# Patient Record
Sex: Female | Born: 1960 | Race: White | Hispanic: No | Marital: Married | Smoking: Former smoker
Health system: Southern US, Community
[De-identification: ages and names within clinical notes are randomized; demographics above are authoritative.]

## PROBLEM LIST (undated history)

## (undated) DIAGNOSIS — R51 Headache: Secondary | ICD-10-CM

## (undated) DIAGNOSIS — N3281 Overactive bladder: Secondary | ICD-10-CM

## (undated) DIAGNOSIS — G4730 Sleep apnea, unspecified: Secondary | ICD-10-CM

## (undated) DIAGNOSIS — N83209 Unspecified ovarian cyst, unspecified side: Secondary | ICD-10-CM

## (undated) DIAGNOSIS — G4700 Insomnia, unspecified: Secondary | ICD-10-CM

## (undated) DIAGNOSIS — R519 Headache, unspecified: Secondary | ICD-10-CM

## (undated) DIAGNOSIS — I10 Essential (primary) hypertension: Secondary | ICD-10-CM

## (undated) DIAGNOSIS — J45909 Unspecified asthma, uncomplicated: Secondary | ICD-10-CM

## (undated) DIAGNOSIS — B009 Herpesviral infection, unspecified: Secondary | ICD-10-CM

## (undated) DIAGNOSIS — K219 Gastro-esophageal reflux disease without esophagitis: Secondary | ICD-10-CM

## (undated) DIAGNOSIS — J302 Other seasonal allergic rhinitis: Secondary | ICD-10-CM

## (undated) DIAGNOSIS — E669 Obesity, unspecified: Secondary | ICD-10-CM

## (undated) HISTORY — DX: Gastro-esophageal reflux disease without esophagitis: K21.9

## (undated) HISTORY — DX: Essential (primary) hypertension: I10

## (undated) HISTORY — DX: Unspecified asthma, uncomplicated: J45.909

## (undated) HISTORY — PX: UPPER GI ENDOSCOPY: SHX6162

## (undated) HISTORY — DX: Insomnia, unspecified: G47.00

## (undated) HISTORY — DX: Other seasonal allergic rhinitis: J30.2

## (undated) HISTORY — PX: WRIST SURGERY: SHX841

## (undated) HISTORY — PX: GASTROSCOPY: SHX1701

## (undated) HISTORY — PX: KNEE ARTHROPLASTY: SHX992

## (undated) HISTORY — DX: Unspecified ovarian cyst, unspecified side: N83.209

## (undated) HISTORY — DX: Obesity, unspecified: E66.9

## (undated) HISTORY — PX: SHOULDER ARTHROSCOPY: SHX128

## (undated) HISTORY — DX: Overactive bladder: N32.81

## (undated) HISTORY — DX: Herpesviral infection, unspecified: B00.9

---

## 1985-10-20 HISTORY — PX: APPENDECTOMY: SHX54

## 1995-10-21 HISTORY — PX: WRIST SURGERY: SHX841

## 1997-10-20 HISTORY — PX: TONSILLECTOMY: SUR1361

## 1999-10-21 HISTORY — PX: BREAST EXCISIONAL BIOPSY: SUR124

## 2001-10-20 HISTORY — PX: EYE SURGERY: SHX253

## 2001-10-20 HISTORY — PX: BLADDER SURGERY: SHX569

## 2003-05-02 ENCOUNTER — Other Ambulatory Visit: Admission: RE | Admit: 2003-05-02 | Discharge: 2003-05-02 | Payer: Self-pay | Admitting: Obstetrics & Gynecology

## 2004-06-07 ENCOUNTER — Other Ambulatory Visit: Admission: RE | Admit: 2004-06-07 | Discharge: 2004-06-07 | Payer: Self-pay | Admitting: Obstetrics & Gynecology

## 2005-07-08 ENCOUNTER — Other Ambulatory Visit: Admission: RE | Admit: 2005-07-08 | Discharge: 2005-07-08 | Payer: Self-pay | Admitting: Obstetrics & Gynecology

## 2005-08-26 ENCOUNTER — Ambulatory Visit: Payer: Self-pay

## 2006-03-12 ENCOUNTER — Ambulatory Visit: Payer: Self-pay | Admitting: Emergency Medicine

## 2006-05-19 ENCOUNTER — Emergency Department: Payer: Self-pay | Admitting: Emergency Medicine

## 2006-08-28 ENCOUNTER — Ambulatory Visit: Payer: Self-pay

## 2008-03-21 ENCOUNTER — Ambulatory Visit: Payer: Self-pay

## 2009-04-25 ENCOUNTER — Ambulatory Visit: Payer: Self-pay | Admitting: Specialist

## 2009-05-24 ENCOUNTER — Ambulatory Visit: Payer: Self-pay | Admitting: Specialist

## 2009-06-01 ENCOUNTER — Ambulatory Visit: Payer: Self-pay | Admitting: Specialist

## 2009-11-29 ENCOUNTER — Ambulatory Visit: Payer: Self-pay

## 2010-05-14 ENCOUNTER — Ambulatory Visit: Payer: Self-pay

## 2010-05-24 ENCOUNTER — Ambulatory Visit: Payer: Self-pay | Admitting: Unknown Physician Specialty

## 2010-05-28 LAB — PATHOLOGY REPORT

## 2010-08-08 ENCOUNTER — Encounter: Payer: Self-pay | Admitting: General Practice

## 2010-08-20 ENCOUNTER — Encounter: Payer: Self-pay | Admitting: General Practice

## 2010-09-19 ENCOUNTER — Encounter: Payer: Self-pay | Admitting: General Practice

## 2011-10-01 ENCOUNTER — Ambulatory Visit: Payer: Self-pay | Admitting: Obstetrics & Gynecology

## 2011-11-30 ENCOUNTER — Ambulatory Visit: Payer: Self-pay | Admitting: Specialist

## 2012-02-13 ENCOUNTER — Ambulatory Visit: Payer: Self-pay | Admitting: Specialist

## 2012-02-13 LAB — CBC
HCT: 41 % (ref 35.0–47.0)
HGB: 13.2 g/dL (ref 12.0–16.0)
MCH: 27.1 pg (ref 26.0–34.0)
MCHC: 32.1 g/dL (ref 32.0–36.0)
MCV: 84 fL (ref 80–100)
Platelet: 273 10*3/uL (ref 150–440)
RBC: 4.86 10*6/uL (ref 3.80–5.20)
RDW: 16.3 % — ABNORMAL HIGH (ref 11.5–14.5)
WBC: 4.9 10*3/uL (ref 3.6–11.0)

## 2012-02-20 ENCOUNTER — Ambulatory Visit: Payer: Self-pay | Admitting: Specialist

## 2012-03-15 ENCOUNTER — Encounter: Payer: Self-pay | Admitting: Specialist

## 2012-03-20 ENCOUNTER — Encounter: Payer: Self-pay | Admitting: Specialist

## 2012-04-19 ENCOUNTER — Encounter: Payer: Self-pay | Admitting: Specialist

## 2012-10-06 ENCOUNTER — Encounter: Payer: Self-pay | Admitting: Podiatry

## 2012-10-20 ENCOUNTER — Encounter: Payer: Self-pay | Admitting: Podiatry

## 2012-11-20 ENCOUNTER — Encounter: Payer: Self-pay | Admitting: Podiatry

## 2012-12-08 ENCOUNTER — Ambulatory Visit: Payer: Self-pay | Admitting: Obstetrics & Gynecology

## 2014-05-16 ENCOUNTER — Ambulatory Visit: Payer: Self-pay | Admitting: Specialist

## 2014-10-10 ENCOUNTER — Ambulatory Visit: Payer: Self-pay | Admitting: Anesthesiology

## 2014-10-10 LAB — POTASSIUM: POTASSIUM: 3.7 mmol/L (ref 3.5–5.1)

## 2014-10-17 ENCOUNTER — Ambulatory Visit: Payer: Self-pay | Admitting: Specialist

## 2015-01-30 ENCOUNTER — Encounter: Payer: Self-pay | Admitting: *Deleted

## 2015-02-11 NOTE — Op Note (Signed)
PATIENT NAME:  April Holloway, April Holloway MR#:  [id number] DATE OF BIRTH:  1961/05/27  DATE OF PROCEDURE:  02/20/2012  PREOPERATIVE DIAGNOSES:  1. Severe degenerative arthritis of acromioclavicular joint, right shoulder.  2. Rotator cuff tear, right shoulder.   POSTOPERATIVE DIAGNOSES:  1. Severe impingement syndrome, right shoulder.  2. Severe acromioclavicular degenerative arthritis.   PROCEDURES:  1. Open anterior acromioplasty.  2. Open excision distal right clavicle.   SURGEON: Myra Rudehristopher Zakari Couchman, M.D.   ANESTHESIA: Supraclavicular block and general.   COMPLICATIONS: None.   ESTIMATED BLOOD LOSS: 100 mL.   DESCRIPTION OF PROCEDURE: One gram of Ancef was given intravenously prior to the procedure. Supraclavicular block and then general anesthesia are induced. The patient is placed in the lawn chair type position with a bump beneath the right shoulder. The right shoulder and arm are thoroughly prepped with alcohol and ChloraPrep and draped in standard sterile fashion. The proposed skin incision is infiltrated with 0.5% Marcaine with epinephrine. Anterosuperior incision is made and the dissection carried down to the muscle and fascia, and the skin undermined with the periosteal elevator. The acromioclavicular joint is identified. The distal clavicle is excised with the saw for approximately 1.5 cm. This is removed and the remaining bone edge of the clavicle is smoothed off with the rongeur. Gelfoam is placed in the interval. Attention is then turned to the anterior acromion where a longitudinal incision is made and the fascia is elevated off of the acromion anteriorly and the deltoid split distally. There is seen to be a huge anterior acromial spur which is impinging on the rotator cuff. Anterior acromioplasty is then performed under direct vision using the rongeur and the TPS bur. Careful inspection of the rotator cuff demonstrates hypertrophic bursal formation and severe inflammation, but no evidence of  true rotator cuff tear. The joint is thoroughly irrigated multiple times. The deltoid muscle and fascia are closed over the anterior acromion using 2-0 Vicryl, the subcutaneous tissue is closed with 3-0 Vicryl, and the skin is closed with the skin stapler. A soft bulky dressing is applied. Sling is applied. The patient is returned to the recovery room in satisfactory condition having tolerated the procedure quite well. ____________________________ Clare Gandyhristopher E. Debraann Livingstone, MD ces:slb D: 02/20/2012 09:56:03 ET T: 02/20/2012 10:57:17 ET JOB#: [id number]  cc: Clare Gandyhristopher E. Suzanne Garbers, MD, <Dictator> Clare GandyHRISTOPHER E Laporche Martelle MD ELECTRONICALLY SIGNED 02/26/2012 11:45

## 2015-02-14 NOTE — Op Note (Signed)
PATIENT NAME:  April Holloway, April Holloway MR#:  [id number] DATE OF BIRTH:  12/07/1960  DATE OF PROCEDURE:  10/17/2014  PREOPERATIVE DIAGNOSIS: Severe impingement syndrome, left shoulder.  POSTOPERATIVE DIAGNOSIS: Severe impingement syndrome, left shoulder.    PROCEDURE: Arthroscopic debridement and anterior acromioplasty, left shoulder.   SURGEON: Clare Gandyhristopher E. Dajana Gehrig, MD   ANESTHESIA: General.   COMPLICATIONS: None.   ESTIMATED BLOOD LOSS: 50 mL   DESCRIPTION OF PROCEDURE: Ancef was given intravenously, prior to the procedure. General anesthesia was induced. The patient was turned over into the right lateral decubitus position and secured with the bean bag. The left upper extremity and shoulder were thoroughly prepped with alcohol and ChloraPrep, and draped in standard sterile fashion. The arthroscope was inserted through a posterior portal into the subacromial space. Outflow was through an anterior portal.   There was seen to be a large amount of hypertrophic synovium and inflammatory tissue present. From a lateral portal, the hypertrophic tissue is removed using the full radial resector. This allows examination of the rotator cuff, which is seen to be intact. There was seen to be an extremely large anterior hypertrophic spur coming off of the anterior acromion. The soft tissue was debrided from the posterior surface of the anterior acromion. The acromionizer was then used to remove the anterior osteophyte back to a normal anatomic level. The bursa was thoroughly irrigated multiple times.  Skin edges were closed with 5-0 nylon. The joint was infiltrated with 30 mL of Marcaine, 0.5% plain, with 4 mg of morphine. A soft bulky dressing is applied. Sling is applied.   The patient is returned to the recovery room in satisfactory condition, having tolerated the procedure quite well.     ____________________________ Clare Gandyhristopher E. Kris Burd, MD ces:MT D: 10/17/2014 12:43:38 ET T: 10/17/2014 14:22:58  ET JOB#: [id number]  cc: Clare Gandyhristopher E. Lillan Mccreadie, MD, <Dictator> Clare GandyHRISTOPHER E Danyeal Akens MD ELECTRONICALLY SIGNED 10/24/2014 12:07

## 2015-09-05 ENCOUNTER — Ambulatory Visit (INDEPENDENT_AMBULATORY_CARE_PROVIDER_SITE_OTHER): Payer: 59 | Admitting: Obstetrics and Gynecology

## 2015-09-05 ENCOUNTER — Encounter: Payer: Self-pay | Admitting: Obstetrics and Gynecology

## 2015-09-05 VITALS — BP 124/79 | HR 102 | Ht 63.0 in | Wt 218.3 lb

## 2015-09-05 DIAGNOSIS — Z1239 Encounter for other screening for malignant neoplasm of breast: Secondary | ICD-10-CM

## 2015-09-05 DIAGNOSIS — Z01419 Encounter for gynecological examination (general) (routine) without abnormal findings: Secondary | ICD-10-CM | POA: Diagnosis not present

## 2015-09-05 NOTE — Patient Instructions (Signed)
Preventive Care for Adults, Female A healthy lifestyle and preventive care can promote health and wellness. Preventive health guidelines for women include the following key practices.  A routine yearly physical is a good way to check with your health care provider about your health and preventive screening. It is a chance to share any concerns and updates on your health and to receive a thorough exam.  Visit your dentist for a routine exam and preventive care every 6 months. Brush your teeth twice a day and floss once a day. Good oral hygiene prevents tooth decay and gum disease.  The frequency of eye exams is based on your age, health, family medical history, use of contact lenses, and other factors. Follow your health care provider's recommendations for frequency of eye exams.  Eat a healthy diet. Foods like vegetables, fruits, whole grains, low-fat dairy products, and lean protein foods contain the nutrients you need without too many calories. Decrease your intake of foods high in solid fats, added sugars, and salt. Eat the right amount of calories for you.Get information about a proper diet from your health care provider, if necessary.  Regular physical exercise is one of the most important things you can do for your health. Most adults should get at least 150 minutes of moderate-intensity exercise (any activity that increases your heart rate and causes you to sweat) each week. In addition, most adults need muscle-strengthening exercises on 2 or more days a week.  Maintain a healthy weight. The body mass index (BMI) is a screening tool to identify possible weight problems. It provides an estimate of body fat based on height and weight. Your health care provider can find your BMI and can help you achieve or maintain a healthy weight.For adults 20 years and older:  A BMI below 18.5 is considered underweight.  A BMI of 18.5 to 24.9 is normal.  A BMI of 25 to 29.9 is considered  overweight.  A BMI of 30 and above is considered obese.  Maintain normal blood lipids and cholesterol levels by exercising and minimizing your intake of saturated fat. Eat a balanced diet with plenty of fruit and vegetables. Blood tests for lipids and cholesterol should begin at age 64 and be repeated every 5 years. If your lipid or cholesterol levels are high, you are over 50, or you are at high risk for heart disease, you may need your cholesterol levels checked more frequently.Ongoing high lipid and cholesterol levels should be treated with medicines if diet and exercise are not working.  If you smoke, find out from your health care provider how to quit. If you do not use tobacco, do not start.  Lung cancer screening is recommended for adults aged 52-80 years who are at high risk for developing lung cancer because of a history of smoking. A yearly low-dose CT scan of the lungs is recommended for people who have at least a 30-pack-year history of smoking and are a current smoker or have quit within the past 15 years. A pack year of smoking is smoking an average of 1 pack of cigarettes a day for 1 year (for example: 1 pack a day for 30 years or 2 packs a day for 15 years). Yearly screening should continue until the smoker has stopped smoking for at least 15 years. Yearly screening should be stopped for people who develop a health problem that would prevent them from having lung cancer treatment.  If you are pregnant, do not drink alcohol. If you are  breastfeeding, be very cautious about drinking alcohol. If you are not pregnant and choose to drink alcohol, do not have more than 1 drink per day. One drink is considered to be 12 ounces (355 mL) of beer, 5 ounces (148 mL) of wine, or 1.5 ounces (44 mL) of liquor.  Avoid use of street drugs. Do not share needles with anyone. Ask for help if you need support or instructions about stopping the use of drugs.  High blood pressure causes heart disease and  increases the risk of stroke. Your blood pressure should be checked at least every 1 to 2 years. Ongoing high blood pressure should be treated with medicines if weight loss and exercise do not work.  If you are 25-78 years old, ask your health care provider if you should take aspirin to prevent strokes.  Diabetes screening is done by taking a blood sample to check your blood glucose level after you have not eaten for a certain period of time (fasting). If you are not overweight and you do not have risk factors for diabetes, you should be screened once every 3 years starting at age 86. If you are overweight or obese and you are 3-87 years of age, you should be screened for diabetes every year as part of your cardiovascular risk assessment.  Breast cancer screening is essential preventive care for women. You should practice "breast self-awareness." This means understanding the normal appearance and feel of your breasts and may include breast self-examination. Any changes detected, no matter how small, should be reported to a health care provider. Women in their 66s and 30s should have a clinical breast exam (CBE) by a health care provider as part of a regular health exam every 1 to 3 years. After age 43, women should have a CBE every year. Starting at age 37, women should consider having a mammogram (breast X-ray test) every year. Women who have a family history of breast cancer should talk to their health care provider about genetic screening. Women at a high risk of breast cancer should talk to their health care providers about having an MRI and a mammogram every year.  Breast cancer gene (BRCA)-related cancer risk assessment is recommended for women who have family members with BRCA-related cancers. BRCA-related cancers include breast, ovarian, tubal, and peritoneal cancers. Having family members with these cancers may be associated with an increased risk for harmful changes (mutations) in the breast  cancer genes BRCA1 and BRCA2. Results of the assessment will determine the need for genetic counseling and BRCA1 and BRCA2 testing.  Your health care provider may recommend that you be screened regularly for cancer of the pelvic organs (ovaries, uterus, and vagina). This screening involves a pelvic examination, including checking for microscopic changes to the surface of your cervix (Pap test). You may be encouraged to have this screening done every 3 years, beginning at age 78.  For women ages 79-65, health care providers may recommend pelvic exams and Pap testing every 3 years, or they may recommend the Pap and pelvic exam, combined with testing for human papilloma virus (HPV), every 5 years. Some types of HPV increase your risk of cervical cancer. Testing for HPV may also be done on women of any age with unclear Pap test results.  Other health care providers may not recommend any screening for nonpregnant women who are considered low risk for pelvic cancer and who do not have symptoms. Ask your health care provider if a screening pelvic exam is right for  you.  If you have had past treatment for cervical cancer or a condition that could lead to cancer, you need Pap tests and screening for cancer for at least 20 years after your treatment. If Pap tests have been discontinued, your risk factors (such as having a new sexual partner) need to be reassessed to determine if screening should resume. Some women have medical problems that increase the chance of getting cervical cancer. In these cases, your health care provider may recommend more frequent screening and Pap tests.  Colorectal cancer can be detected and often prevented. Most routine colorectal cancer screening begins at the age of 37 years and continues through age 10 years. However, your health care provider may recommend screening at an earlier age if you have risk factors for colon cancer. On a yearly basis, your health care provider may provide  home test kits to check for hidden blood in the stool. Use of a small camera at the end of a tube, to directly examine the colon (sigmoidoscopy or colonoscopy), can detect the earliest forms of colorectal cancer. Talk to your health care provider about this at age 20, when routine screening begins. Direct exam of the colon should be repeated every 5-10 years through age 31 years, unless early forms of precancerous polyps or small growths are found.  People who are at an increased risk for hepatitis B should be screened for this virus. You are considered at high risk for hepatitis B if:  You were born in a country where hepatitis B occurs often. Talk with your health care provider about which countries are considered high risk.  Your parents were born in a high-risk country and you have not received a shot to protect against hepatitis B (hepatitis B vaccine).  You have HIV or AIDS.  You use needles to inject street drugs.  You live with, or have sex with, someone who has hepatitis B.  You get hemodialysis treatment.  You take certain medicines for conditions like cancer, organ transplantation, and autoimmune conditions.  Hepatitis C blood testing is recommended for all people born from [birth year] through 1965 and any individual with known risks for hepatitis C.  Practice safe sex. Use condoms and avoid high-risk sexual practices to reduce the spread of sexually transmitted infections (STIs). STIs include gonorrhea, chlamydia, syphilis, trichomonas, herpes, HPV, and human immunodeficiency virus (HIV). Herpes, HIV, and HPV are viral illnesses that have no cure. They can result in disability, cancer, and death.  You should be screened for sexually transmitted illnesses (STIs) including gonorrhea and chlamydia if:  You are sexually active and are younger than 24 years.  You are older than 24 years and your health care provider tells you that you are at risk for this type of infection.  Your sexual  activity has changed since you were last screened and you are at an increased risk for chlamydia or gonorrhea. Ask your health care provider if you are at risk.  If you are at risk of being infected with HIV, it is recommended that you take a prescription medicine daily to prevent HIV infection. This is called preexposure prophylaxis (PrEP). You are considered at risk if:  You are sexually active and do not regularly use condoms or know the HIV status of your partner(s).  You take drugs by injection.  You are sexually active with a partner who has HIV.  Talk with your health care provider about whether you are at high risk of being infected with HIV. If  you choose to begin PrEP, you should first be tested for HIV. You should then be tested every 3 months for as long as you are taking PrEP.  Osteoporosis is a disease in which the bones lose minerals and strength with aging. This can result in serious bone fractures or breaks. The risk of osteoporosis can be identified using a bone density scan. Women ages 1 years and over and women at risk for fractures or osteoporosis should discuss screening with their health care providers. Ask your health care provider whether you should take a calcium supplement or vitamin D to reduce the rate of osteoporosis.  Menopause can be associated with physical symptoms and risks. Hormone replacement therapy is available to decrease symptoms and risks. You should talk to your health care provider about whether hormone replacement therapy is right for you.  Use sunscreen. Apply sunscreen liberally and repeatedly throughout the day. You should seek shade when your shadow is shorter than you. Protect yourself by wearing long sleeves, pants, a wide-brimmed hat, and sunglasses year round, whenever you are outdoors.  Once a month, do a whole body skin exam, using a mirror to look at the skin on your back. Tell your health care provider of new moles, moles that have irregular  borders, moles that are larger than a pencil eraser, or moles that have changed in shape or color.  Stay current with required vaccines (immunizations).  Influenza vaccine. All adults should be immunized every year.  Tetanus, diphtheria, and acellular pertussis (Td, Tdap) vaccine. Pregnant women should receive 1 dose of Tdap vaccine during each pregnancy. The dose should be obtained regardless of the length of time since the last dose. Immunization is preferred during the 27th-36th week of gestation. An adult who has not previously received Tdap or who does not know her vaccine status should receive 1 dose of Tdap. This initial dose should be followed by tetanus and diphtheria toxoids (Td) booster doses every 10 years. Adults with an unknown or incomplete history of completing a 3-dose immunization series with Td-containing vaccines should begin or complete a primary immunization series including a Tdap dose. Adults should receive a Td booster every 10 years.  Varicella vaccine. An adult without evidence of immunity to varicella should receive 2 doses or a second dose if she has previously received 1 dose. Pregnant females who do not have evidence of immunity should receive the first dose after pregnancy. This first dose should be obtained before leaving the health care facility. The second dose should be obtained 4-8 weeks after the first dose.  Human papillomavirus (HPV) vaccine. Females aged 13-26 years who have not received the vaccine previously should obtain the 3-dose series. The vaccine is not recommended for use in pregnant females. However, pregnancy testing is not needed before receiving a dose. If a female is found to be pregnant after receiving a dose, no treatment is needed. In that case, the remaining doses should be delayed until after the pregnancy. Immunization is recommended for any person with an immunocompromised condition through the age of 24 years if she did not get any or all doses  earlier. During the 3-dose series, the second dose should be obtained 4-8 weeks after the first dose. The third dose should be obtained 24 weeks after the first dose and 16 weeks after the second dose.  Zoster vaccine. One dose is recommended for adults aged 97 years or older unless certain conditions are present.  Measles, mumps, and rubella (MMR) vaccine. Adults born  before 1957 generally are considered immune to measles and mumps. Adults born in [birth year] or later should have 1 or more doses of MMR vaccine unless there is a contraindication to the vaccine or there is laboratory evidence of immunity to each of the three diseases. A routine second dose of MMR vaccine should be obtained at least 28 days after the first dose for students attending postsecondary schools, health care workers, or international travelers. People who received inactivated measles vaccine or an unknown type of measles vaccine during 1963-1967 should receive 2 doses of MMR vaccine. People who received inactivated mumps vaccine or an unknown type of mumps vaccine before 1979 and are at high risk for mumps infection should consider immunization with 2 doses of MMR vaccine. For females of childbearing age, rubella immunity should be determined. If there is no evidence of immunity, females who are not pregnant should be vaccinated. If there is no evidence of immunity, females who are pregnant should delay immunization until after pregnancy. Unvaccinated health care workers born before [birth year] who lack laboratory evidence of measles, mumps, or rubella immunity or laboratory confirmation of disease should consider measles and mumps immunization with 2 doses of MMR vaccine or rubella immunization with 1 dose of MMR vaccine.  Pneumococcal 13-valent conjugate (PCV13) vaccine. When indicated, a person who is uncertain of his immunization history and has no record of immunization should receive the PCV13 vaccine. All adults 61 years of age and older  should receive this vaccine. An adult aged 92 years or older who has certain medical conditions and has not been previously immunized should receive 1 dose of PCV13 vaccine. This PCV13 should be followed with a dose of pneumococcal polysaccharide (PPSV23) vaccine. Adults who are at high risk for pneumococcal disease should obtain the PPSV23 vaccine at least 8 weeks after the dose of PCV13 vaccine. Adults older than 52 years of age who have normal immune system function should obtain the PPSV23 vaccine dose at least 1 year after the dose of PCV13 vaccine.  Pneumococcal polysaccharide (PPSV23) vaccine. When PCV13 is also indicated, PCV13 should be obtained first. All adults aged 2 years and older should be immunized. An adult younger than age 30 years who has certain medical conditions should be immunized. Any person who resides in a nursing home or long-term care facility should be immunized. An adult smoker should be immunized. People with an immunocompromised condition and certain other conditions should receive both PCV13 and PPSV23 vaccines. People with human immunodeficiency virus (HIV) infection should be immunized as soon as possible after diagnosis. Immunization during chemotherapy or radiation therapy should be avoided. Routine use of PPSV23 vaccine is not recommended for American Indians, [place] Natives, or people younger than 65 years unless there are medical conditions that require PPSV23 vaccine. When indicated, people who have unknown immunization and have no record of immunization should receive PPSV23 vaccine. One-time revaccination 5 years after the first dose of PPSV23 is recommended for people aged 19-64 years who have chronic kidney failure, nephrotic syndrome, asplenia, or immunocompromised conditions. People who received 1-2 doses of PPSV23 before age 44 years should receive another dose of PPSV23 vaccine at age 83 years or later if at least 5 years have passed since the previous dose. Doses  of PPSV23 are not needed for people immunized with PPSV23 at or after age 20 years.  Meningococcal vaccine. Adults with asplenia or persistent complement component deficiencies should receive 2 doses of quadrivalent meningococcal conjugate (MenACWY-D) vaccine. The doses should be obtained  at least 2 months apart. Microbiologists working with certain meningococcal bacteria, [employer] recruits, people at risk during an outbreak, and people who travel to or live in countries with a high rate of meningitis should be immunized. A first-year college student up through age 68 years who is living in a residence hall should receive a dose if she did not receive a dose on or after her 16th birthday. Adults who have certain high-risk conditions should receive one or more doses of vaccine.  Hepatitis A vaccine. Adults who wish to be protected from this disease, have certain high-risk conditions, work with hepatitis A-infected animals, work in hepatitis A research labs, or travel to or work in countries with a high rate of hepatitis A should be immunized. Adults who were previously unvaccinated and who anticipate close contact with an international adoptee during the first 60 days after arrival in the [place] from a country with a high rate of hepatitis A should be immunized.  Hepatitis B vaccine. Adults who wish to be protected from this disease, have certain high-risk conditions, may be exposed to blood or other infectious body fluids, are household contacts or sex partners of hepatitis B positive people, are clients or workers in certain care facilities, or travel to or work in countries with a high rate of hepatitis B should be immunized.  Haemophilus influenzae type b (Hib) vaccine. A previously unvaccinated person with asplenia or sickle cell disease or having a scheduled splenectomy should receive 1 dose of Hib vaccine. Regardless of previous immunization, a recipient of a hematopoietic stem cell transplant  should receive a 3-dose series 6-12 months after her successful transplant. Hib vaccine is not recommended for adults with HIV infection. Preventive Services / Frequency Ages 76 to 49 years  Blood pressure check.** / Every 3-5 years.  Lipid and cholesterol check.** / Every 5 years beginning at age 68.  Clinical breast exam.** / Every 3 years for women in their 110s and 34s.  BRCA-related cancer risk assessment.** / For women who have family members with a BRCA-related cancer (breast, ovarian, tubal, or peritoneal cancers).  Pap test.** / Every 2 years from ages 68 through 51. Every 3 years starting at age 45 through age 18 or 99 with a history of 3 consecutive normal Pap tests.  HPV screening.** / Every 3 years from ages 19 through ages 46 to 70 with a history of 3 consecutive normal Pap tests.  Hepatitis C blood test.** / For any individual with known risks for hepatitis C.  Skin self-exam. / Monthly.  Influenza vaccine. / Every year.  Tetanus, diphtheria, and acellular pertussis (Tdap, Td) vaccine.** / Consult your health care provider. Pregnant women should receive 1 dose of Tdap vaccine during each pregnancy. 1 dose of Td every 10 years.  Varicella vaccine.** / Consult your health care provider. Pregnant females who do not have evidence of immunity should receive the first dose after pregnancy.  HPV vaccine. / 3 doses over 6 months, if 21 and younger. The vaccine is not recommended for use in pregnant females. However, pregnancy testing is not needed before receiving a dose.  Measles, mumps, rubella (MMR) vaccine.** / You need at least 1 dose of MMR if you were born in 1957 or later. You may also need a 2nd dose. For females of childbearing age, rubella immunity should be determined. If there is no evidence of immunity, females who are not pregnant should be vaccinated. If there is no evidence of immunity, females who are  pregnant should delay immunization until after  pregnancy.  Pneumococcal 13-valent conjugate (PCV13) vaccine.** / Consult your health care provider.  Pneumococcal polysaccharide (PPSV23) vaccine.** / 1 to 2 doses if you smoke cigarettes or if you have certain conditions.  Meningococcal vaccine.** / 1 dose if you are age 87 to 44 years and a Market researcher living in a residence hall, or have one of several medical conditions, you need to get vaccinated against meningococcal disease. You may also need additional booster doses.  Hepatitis A vaccine.** / Consult your health care provider.  Hepatitis B vaccine.** / Consult your health care provider.  Haemophilus influenzae type b (Hib) vaccine.** / Consult your health care provider. Ages 86 to 38 years  Blood pressure check.** / Every year.  Lipid and cholesterol check.** / Every 5 years beginning at age 49 years.  Lung cancer screening. / Every year if you are aged 71-80 years and have a 30-pack-year history of smoking and currently smoke or have quit within the past 15 years. Yearly screening is stopped once you have quit smoking for at least 15 years or develop a health problem that would prevent you from having lung cancer treatment.  Clinical breast exam.** / Every year after age 51 years.  BRCA-related cancer risk assessment.** / For women who have family members with a BRCA-related cancer (breast, ovarian, tubal, or peritoneal cancers).  Mammogram.** / Every year beginning at age 18 years and continuing for as long as you are in good health. Consult with your health care provider.  Pap test.** / Every 3 years starting at age 63 years through age 37 or 57 years with a history of 3 consecutive normal Pap tests.  HPV screening.** / Every 3 years from ages 41 years through ages 76 to 23 years with a history of 3 consecutive normal Pap tests.  Fecal occult blood test (FOBT) of stool. / Every year beginning at age 36 years and continuing until age 51 years. You may not need  to do this test if you get a colonoscopy every 10 years.  Flexible sigmoidoscopy or colonoscopy.** / Every 5 years for a flexible sigmoidoscopy or every 10 years for a colonoscopy beginning at age 36 years and continuing until age 35 years.  Hepatitis C blood test.** / For all people born from [birth year] through 1963 and any individual with known risks for hepatitis C.  Skin self-exam. / Monthly.  Influenza vaccine. / Every year.  Tetanus, diphtheria, and acellular pertussis (Tdap/Td) vaccine.** / Consult your health care provider. Pregnant women should receive 1 dose of Tdap vaccine during each pregnancy. 1 dose of Td every 10 years.  Varicella vaccine.** / Consult your health care provider. Pregnant females who do not have evidence of immunity should receive the first dose after pregnancy.  Zoster vaccine.** / 1 dose for adults aged 73 years or older.  Measles, mumps, rubella (MMR) vaccine.** / You need at least 1 dose of MMR if you were born in 1957 or later. You may also need a second dose. For females of childbearing age, rubella immunity should be determined. If there is no evidence of immunity, females who are not pregnant should be vaccinated. If there is no evidence of immunity, females who are pregnant should delay immunization until after pregnancy.  Pneumococcal 13-valent conjugate (PCV13) vaccine.** / Consult your health care provider.  Pneumococcal polysaccharide (PPSV23) vaccine.** / 1 to 2 doses if you smoke cigarettes or if you have certain conditions.  Meningococcal vaccine.** /  Consult your health care provider.  Hepatitis A vaccine.** / Consult your health care provider.  Hepatitis B vaccine.** / Consult your health care provider.  Haemophilus influenzae type b (Hib) vaccine.** / Consult your health care provider. Ages 74 years and over  Blood pressure check.** / Every year.  Lipid and cholesterol check.** / Every 5 years beginning at age 53 years.  Lung cancer  screening. / Every year if you are aged 27-80 years and have a 30-pack-year history of smoking and currently smoke or have quit within the past 15 years. Yearly screening is stopped once you have quit smoking for at least 15 years or develop a health problem that would prevent you from having lung cancer treatment.  Clinical breast exam.** / Every year after age 63 years.  BRCA-related cancer risk assessment.** / For women who have family members with a BRCA-related cancer (breast, ovarian, tubal, or peritoneal cancers).  Mammogram.** / Every year beginning at age 81 years and continuing for as long as you are in good health. Consult with your health care provider.  Pap test.** / Every 3 years starting at age 31 years through age 64 or 74 years with 3 consecutive normal Pap tests. Testing can be stopped between 65 and 70 years with 3 consecutive normal Pap tests and no abnormal Pap or HPV tests in the past 10 years.  HPV screening.** / Every 3 years from ages 67 years through ages 33 or 16 years with a history of 3 consecutive normal Pap tests. Testing can be stopped between 65 and 70 years with 3 consecutive normal Pap tests and no abnormal Pap or HPV tests in the past 10 years.  Fecal occult blood test (FOBT) of stool. / Every year beginning at age 32 years and continuing until age 69 years. You may not need to do this test if you get a colonoscopy every 10 years.  Flexible sigmoidoscopy or colonoscopy.** / Every 5 years for a flexible sigmoidoscopy or every 10 years for a colonoscopy beginning at age 93 years and continuing until age 97 years.  Hepatitis C blood test.** / For all people born from [birth year] through 1963 and any individual with known risks for hepatitis C.  Osteoporosis screening.** / A one-time screening for women ages 61 years and over and women at risk for fractures or osteoporosis.  Skin self-exam. / Monthly.  Influenza vaccine. / Every year.  Tetanus, diphtheria, and  acellular pertussis (Tdap/Td) vaccine.** / 1 dose of Td every 10 years.  Varicella vaccine.** / Consult your health care provider.  Zoster vaccine.** / 1 dose for adults aged 49 years or older.  Pneumococcal 13-valent conjugate (PCV13) vaccine.** / Consult your health care provider.  Pneumococcal polysaccharide (PPSV23) vaccine.** / 1 dose for all adults aged 62 years and older.  Meningococcal vaccine.** / Consult your health care provider.  Hepatitis A vaccine.** / Consult your health care provider.  Hepatitis B vaccine.** / Consult your health care provider.  Haemophilus influenzae type b (Hib) vaccine.** / Consult your health care provider. ** Family history and personal history of risk and conditions may change your health care provider's recommendations.   This information is not intended to replace advice given to you by your health care provider. Make sure you discuss any questions you have with your health care provider.   Document Released: 12/02/2001 Document Revised: 10/27/2014 Document Reviewed: 03/03/2011 Elsevier Interactive Patient Education [employer].

## 2015-09-06 LAB — COMPREHENSIVE METABOLIC PANEL
ALBUMIN: 4.2 g/dL (ref 3.5–5.5)
ALK PHOS: 91 IU/L (ref 39–117)
ALT: 19 IU/L (ref 0–32)
AST: 23 IU/L (ref 0–40)
Albumin/Globulin Ratio: 1.8 (ref 1.1–2.5)
BILIRUBIN TOTAL: 0.3 mg/dL (ref 0.0–1.2)
BUN / CREAT RATIO: 19 (ref 9–23)
BUN: 15 mg/dL (ref 6–24)
CHLORIDE: 97 mmol/L (ref 97–106)
CO2: 26 mmol/L (ref 18–29)
Calcium: 10 mg/dL (ref 8.7–10.2)
Creatinine, Ser: 0.8 mg/dL (ref 0.57–1.00)
GFR calc Af Amer: 98 mL/min/{1.73_m2} (ref >59)
GFR calc non Af Amer: 85 mL/min/{1.73_m2} (ref >59)
GLOBULIN, TOTAL: 2.3 g/dL (ref 1.5–4.5)
GLUCOSE: 101 mg/dL — AB (ref 65–99)
Potassium: 3.3 mmol/L — ABNORMAL LOW (ref 3.5–5.2)
SODIUM: 140 mmol/L (ref 136–144)
Total Protein: 6.5 g/dL (ref 6.0–8.5)

## 2015-09-06 LAB — CBC
HEMOGLOBIN: 12.2 g/dL (ref 11.1–15.9)
Hematocrit: 37 % (ref 34.0–46.6)
MCH: 25.6 pg — AB (ref 26.6–33.0)
MCHC: 33 g/dL (ref 31.5–35.7)
MCV: 78 fL — ABNORMAL LOW (ref 79–97)
Platelets: 314 10*3/uL (ref 150–379)
RBC: 4.77 x10E6/uL (ref 3.77–5.28)
RDW: 14.6 % (ref 12.3–15.4)
WBC: 6.6 10*3/uL (ref 3.4–10.8)

## 2015-09-06 LAB — LIPID PANEL
CHOL/HDL RATIO: 4.5 ratio — AB (ref 0.0–4.4)
Cholesterol, Total: 238 mg/dL — ABNORMAL HIGH (ref 100–199)
HDL: 53 mg/dL (ref >39)
LDL CALC: 153 mg/dL — AB (ref 0–99)
TRIGLYCERIDES: 158 mg/dL — AB (ref 0–149)
VLDL CHOLESTEROL CAL: 32 mg/dL (ref 5–40)

## 2015-09-06 LAB — TSH: TSH: 1.19 u[IU]/mL (ref 0.450–4.500)

## 2015-09-08 NOTE — Progress Notes (Signed)
GYNECOLOGY ANNUAL EXAM CLINIC PROGRESS NOTE  Subjective:    April Holloway is a 54 y.o. married female who presents for an annual exam. Transitioning care from [facility] (in [place]). The patient has no complaints today. The patient is sexually active. GYN screening history: last pap: approximate date 2013 and was normal and last mammogram: approximate date 06/2014 and was normal. The patient wears seatbelts: yes. The patient participates in regular exercise: not asked. Has the patient ever been transfused or tattooed?: no. The patient reports that there is not domestic violence in her life.   Menstrual History: OB History    Gravida Para Term Preterm AB TAB SAB Ectopic Multiple Living        Menarche age: 3 No LMP recorded. Patient is not currently having periods (Reason: IUD). Denies h/o abnormal pap smears or STIs.  Last colonoscopy 3 years ago.    Past Medical History  Diagnosis Date  . Insomnia   . Asthma   . Seasonal allergies   . Hypertension   . Obesity   . HSV infection   . Overactive bladder     Past Surgical History  Procedure Laterality Date  . Appendectomy    . Knee arthroplasty Left   . Shoulder arthroscopy Bilateral   . Wrist surgery Right   . Tonsillectomy    . Breast biopsy Right     Family History  Problem Relation Age of Onset  . Cancer Mother   . Uterine cancer Mother   . Hypertension Mother   . Fibromyalgia Mother   . Cancer Father     Melanoma  . Hypertension Father   . Arthritis Father     Social History   Social History  . Marital Status: Married    Spouse Name: N/A  . Number of Children: N/A  . Years of Education: N/A   Occupational History  . Not on file.   Social History Main Topics  . Smoking status: Former Smoker    Quit date: 10/20/1992  . Smokeless tobacco: Not on file  . Alcohol Use: No  . Drug Use: No  . Sexual Activity: Yes    Birth Control/ Protection: IUD   Other Topics Concern   . Not on file   Social History Narrative  . No narrative on file    Outpatient Encounter Prescriptions as of 09/05/2015  Medication Sig Note  . DULoxetine (CYMBALTA) 60 MG capsule  09/05/2015: Received from: External Pharmacy  . DYMISTA 137-50 MCG/ACT SUSP  09/05/2015: Received from: External Pharmacy  . hydrochlorothiazide (HYDRODIURIL) 25 MG tablet  09/05/2015: Received from: External Pharmacy  . Melatonin 10 MG TABS Take by mouth.   . montelukast (SINGULAIR) 10 MG tablet  09/05/2015: Received from: External Pharmacy  . QUEtiapine (SEROQUEL) 100 MG tablet  09/05/2015: Received from: External Pharmacy  . TOVIAZ 8 MG TB24 tablet  09/05/2015: Received from: External Pharmacy   No facility-administered encounter medications on file as of 09/05/2015.    No Known Allergies   Review of Systems  Constitutional: negative for chills, fatigue, fevers and sweats Eyes: negative for irritation, redness and visual disturbance Ears, nose, mouth, throat, and face: negative for hearing loss, nasal congestion, snoring and tinnitus Respiratory: negative for asthma, cough, sputum Cardiovascular: negative for chest pain, dyspnea, exertional chest pressure/discomfort, irregular heart beat, palpitations and syncope Gastrointestinal: negative for abdominal pain, change in bowel habits, nausea and vomiting Genitourinary: negative for abnormal menstrual periods,  genital lesions, sexual problems and vaginal discharge, dysuria and urinary incontinence Integument/breast: negative for breast lump, breast tenderness and nipple discharge Hematologic/lymphatic: negative for bleeding and easy bruising Musculoskeletal:negative for back pain and muscle weakness Neurological: negative for dizziness, headaches, vertigo and weakness Endocrine: negative for diabetic symptoms including polydipsia, polyuria and skin dryness Allergic/Immunologic: negative for hay fever and urticaria    Objective:   Blood pressure  124/79, pulse 102, height 5\' 3"  (1.6 m), weight 218 lb 4.8 oz (99.02 kg).  General Appearance:    Alert, cooperative, no distress, appears stated age  Head:    Normocephalic, without obvious abnormality, atraumatic  Eyes:    PERRL, conjunctiva/corneas clear, EOM's intact, both eyes  Ears:    Normal external ear canals, both ears  Nose:   Nares normal, septum midline, mucosa normal, no drainage or sinus tenderness  Throat:   Lips, mucosa, and tongue normal; teeth and gums normal  Neck:   Supple, symmetrical, trachea midline, no adenopathy; thyroid: no enlargement/tenderness/nodules; no carotid bruit or JVD  Back:     Symmetric, no curvature, ROM normal, no CVA tenderness  Lungs:     Clear to auscultation bilaterally, respirations unlabored  Chest Wall:    No tenderness or deformity   Heart:    Regular rate and rhythm, S1 and S2 normal, no murmur, rub or gallop  Breast Exam:    No tenderness, masses, or nipple abnormality  Abdomen:     Soft, non-tender, bowel sounds active all four quadrants, no masses, no organomegaly.    Genitalia:    Pelvic:external genitalia normal, vagina without lesions, discharge, or tenderness, rectovaginal septum  normal. Cervix normal in appearance, no cervical motion tenderness, no adnexal masses or tenderness.  Uterus normal size, shape, consistency, mobile.    Rectal:    Normal external sphincter.  No hemorrhoids appreciated. Internal exam not done.   Extremities:   Extremities normal, atraumatic, no cyanosis or edema  Pulses:   2+ and symmetric all extremities  Skin:   Skin color, texture, turgor normal, no rashes or lesions  Lymph nodes:   Cervical, supraclavicular, and axillary nodes normal  Neurologic:   CNII-XII intact, normal strength, sensation and reflexes throughout     Assessment:    Healthy female exam.    Plan:     Blood tests: Basic metabolic panel, CBC with diff, Lipoproteins and TSH. Breast self exam technique reviewed and patient encouraged  to perform self-exam monthly. Contraception: IUD (Mirena), in place x 3 years. Discussed healthy lifestyle modifications. Mammogram ordered. Pap smear performed.   RTC in 1 year, or as needed.   Hildred LaserAnika Laruth Hanger, MD [facility]

## 2015-09-09 ENCOUNTER — Encounter: Payer: Self-pay | Admitting: Obstetrics and Gynecology

## 2015-09-09 LAB — VITAMIN D 1,25 DIHYDROXY
VITAMIN D 1, 25 (OH) TOTAL: 71 pg/mL
Vitamin D2 1, 25 (OH)2: <10 pg/mL
Vitamin D3 1, 25 (OH)2: 71 pg/mL

## 2015-09-13 LAB — PAP IG AND HPV HIGH-RISK
HPV, HIGH-RISK: NEGATIVE
PAP Smear Comment: 0

## 2015-10-03 ENCOUNTER — Other Ambulatory Visit
Admission: RE | Admit: 2015-10-03 | Discharge: 2015-10-03 | Disposition: A | Discharge status: Home / Self Care | Payer: 59 | Source: Ambulatory Visit | Attending: Psychiatry | Admitting: Psychiatry

## 2015-10-03 DIAGNOSIS — Z79899 Other long term (current) drug therapy: Secondary | ICD-10-CM | POA: Insufficient documentation

## 2015-10-03 DIAGNOSIS — F331 Major depressive disorder, recurrent, moderate: Secondary | ICD-10-CM | POA: Diagnosis present

## 2015-10-03 LAB — HEMOGLOBIN A1C: HEMOGLOBIN A1C: 5.4 % (ref 4.0–6.0)

## 2015-11-19 ENCOUNTER — Other Ambulatory Visit: Payer: Self-pay | Admitting: Neurology

## 2015-11-19 DIAGNOSIS — F419 Anxiety disorder, unspecified: Secondary | ICD-10-CM | POA: Diagnosis not present

## 2015-11-19 DIAGNOSIS — F331 Major depressive disorder, recurrent, moderate: Secondary | ICD-10-CM | POA: Diagnosis not present

## 2015-11-19 MED ORDER — MONTELUKAST SODIUM 10 MG PO TABS: 10.0000 mg | ORAL_TABLET | Freq: Every day | ORAL | Status: DC

## 2016-01-25 DIAGNOSIS — Z0000 Encounter for general adult medical examination without abnormal findings: Secondary | ICD-10-CM | POA: Diagnosis not present

## 2016-01-25 DIAGNOSIS — I10 Essential (primary) hypertension: Secondary | ICD-10-CM | POA: Diagnosis not present

## 2016-01-25 DIAGNOSIS — F3342 Major depressive disorder, recurrent, in full remission: Secondary | ICD-10-CM | POA: Diagnosis not present

## 2016-01-25 DIAGNOSIS — E785 Hyperlipidemia, unspecified: Secondary | ICD-10-CM | POA: Diagnosis not present

## 2016-01-25 DIAGNOSIS — Z23 Encounter for immunization: Secondary | ICD-10-CM | POA: Diagnosis not present

## 2016-02-21 DIAGNOSIS — F419 Anxiety disorder, unspecified: Secondary | ICD-10-CM | POA: Diagnosis not present

## 2016-02-21 DIAGNOSIS — F331 Major depressive disorder, recurrent, moderate: Secondary | ICD-10-CM | POA: Diagnosis not present

## 2016-02-28 ENCOUNTER — Ambulatory Visit (INDEPENDENT_AMBULATORY_CARE_PROVIDER_SITE_OTHER): Payer: 59 | Admitting: Allergy and Immunology

## 2016-02-28 VITALS — BP 130/90 | HR 88 | Temp 98.0°F | Resp 16

## 2016-02-28 DIAGNOSIS — J310 Chronic rhinitis: Secondary | ICD-10-CM

## 2016-02-28 DIAGNOSIS — J4540 Moderate persistent asthma, uncomplicated: Secondary | ICD-10-CM | POA: Diagnosis not present

## 2016-02-28 NOTE — Patient Instructions (Signed)
   Continue current medication regime.  Saline nasal wash each evening at shower time.  Follow-up in 6 months or sooner if needed.    

## 2016-02-28 NOTE — Progress Notes (Signed)
     FOLLOW UP NOTE  RE: April Ponderina K Kutscher MRN: [id number] DOB: 05-14-1961 [facility] [address] [place] [id number] Date of Office Visit: 02/28/2016  Subjective:  April Holloway is a 55 y.o. female who presents today for Asthma  Assessment:   1. Moderate persistent asthma, appears well controlled.   2. Mixed rhinitis.    Plan:  1.  April Holloway will continue current medication regime--Advair, Singulair, Dymista and Zyrtec. 2.  Saline nasal wash each evening at shower time. 3.  If any acute symptoms, increase Advair to twice daily and communicate the office for acute appointment. 4.  Follow-up in 6 months or sooner if needed.  HPI: April Holloway returns to the office in follow-up of asthma and chronic rhinitis.  She describes doing very well and finds the medication regime is definitely beneficial.  Since her last visit in September, she describes no recurring symptoms, had a great winter and doing well this spring.  Is active, biking outdoors without issues including losing 43 pounds.  She is consistently use Zyrtec since February and nasal management is beneficial and only requiring  Advair once a day.  Denies cough, wheeze, shortness breath, chest congestion, difficulty breathing, rhinorrhea, sneezing, itchy watery eyes or nasal congestion.  Nor disruption of sleep or activity.  She is very pleased with this regime requests to continue.  No recurring reflux difficulties currently. Denies ED or urgent care visits, prednisone or antibiotic courses.  Denies recent Xopenex use.  No new medical concerns.  April Holloway has a current medication list which includes the following prescription(s): duloxetine, dymista, fluticasone-salmeterol, hydrochlorothiazide, levalbuterol, melatonin, montelukast, quetiapine, toviaz.   Drug Allergies: No Known Allergies history  Objective:   Filed Vitals:   02/28/16 1707  BP: 130/90  Pulse: 88  Temp: 98 F (36.7 C)  Resp: 16   SpO2  Readings from Last 1 Encounters:  02/28/16 98%   Physical Exam  Constitutional: She is well-developed, well-nourished, and in no distress.  HENT:  Head: Atraumatic.  Right Ear: Tympanic membrane and ear canal normal.  Left Ear: Tympanic membrane and ear canal normal.  Nose: Mucosal edema present. No rhinorrhea. No epistaxis.  Mouth/Throat: Oropharynx is clear and moist and mucous membranes are normal. No oropharyngeal exudate, posterior oropharyngeal edema or posterior oropharyngeal erythema.  Neck: Neck supple.  Cardiovascular: Normal rate, S1 normal and S2 normal.   No murmur heard. Pulmonary/Chest: Effort normal. She has no wheezes. She has no rhonchi. She has no rales.  Lymphadenopathy:    She has no cervical adenopathy.   Diagnostics: Spirometry:  FVC 2.66--88%, FEV1 2.33--94%.    Roselyn M. Willa RoughHicks, MD  cc: Frederich ChickWEBB, CAROL D, MD

## 2016-03-25 ENCOUNTER — Other Ambulatory Visit: Payer: Self-pay | Admitting: Allergy and Immunology

## 2016-05-23 DIAGNOSIS — F331 Major depressive disorder, recurrent, moderate: Secondary | ICD-10-CM | POA: Diagnosis not present

## 2016-05-23 DIAGNOSIS — F419 Anxiety disorder, unspecified: Secondary | ICD-10-CM | POA: Diagnosis not present

## 2016-06-26 ENCOUNTER — Other Ambulatory Visit: Payer: Self-pay | Admitting: Nurse Practitioner

## 2016-06-26 ENCOUNTER — Other Ambulatory Visit (HOSPITAL_COMMUNITY): Payer: Self-pay | Admitting: Nurse Practitioner

## 2016-06-26 DIAGNOSIS — R1013 Epigastric pain: Secondary | ICD-10-CM | POA: Diagnosis not present

## 2016-06-26 DIAGNOSIS — R110 Nausea: Secondary | ICD-10-CM | POA: Diagnosis not present

## 2016-07-04 ENCOUNTER — Other Ambulatory Visit
Admission: RE | Admit: 2016-07-04 | Discharge: 2016-07-04 | Disposition: A | Discharge status: Home / Self Care | Payer: 59 | Source: Ambulatory Visit | Attending: Nurse Practitioner | Admitting: Nurse Practitioner

## 2016-07-04 DIAGNOSIS — N912 Amenorrhea, unspecified: Secondary | ICD-10-CM | POA: Insufficient documentation

## 2016-07-04 LAB — PREGNANCY, URINE: Preg Test, Ur: NEGATIVE

## 2016-07-07 ENCOUNTER — Ambulatory Visit
Admission: RE | Admit: 2016-07-07 | Discharge: 2016-07-07 | Disposition: A | Discharge status: Home / Self Care | Payer: 59 | Source: Ambulatory Visit | Attending: Nurse Practitioner | Admitting: Nurse Practitioner

## 2016-07-07 ENCOUNTER — Encounter
Admission: RE | Admit: 2016-07-07 | Discharge: 2016-07-07 | Disposition: A | Discharge status: Home / Self Care | Payer: 59 | Source: Ambulatory Visit | Attending: Nurse Practitioner | Admitting: Nurse Practitioner

## 2016-07-07 DIAGNOSIS — R1013 Epigastric pain: Secondary | ICD-10-CM

## 2016-07-07 DIAGNOSIS — N200 Calculus of kidney: Secondary | ICD-10-CM | POA: Diagnosis not present

## 2016-07-07 DIAGNOSIS — R110 Nausea: Secondary | ICD-10-CM | POA: Diagnosis present

## 2016-07-07 DIAGNOSIS — R109 Unspecified abdominal pain: Secondary | ICD-10-CM | POA: Diagnosis not present

## 2016-07-07 MED ORDER — TECHNETIUM TC 99M MEBROFENIN IV KIT
5.0500 | PACK | Freq: Once | INTRAVENOUS | Status: AC | PRN
  Administered 2016-07-07: 5.05 via INTRAVENOUS

## 2016-07-16 ENCOUNTER — Other Ambulatory Visit: Payer: Self-pay

## 2016-07-16 MED ORDER — FLUTICASONE-SALMETEROL 115-21 MCG/ACT IN AERO: 2.0000 | INHALATION_SPRAY | Freq: Two times a day (BID) | RESPIRATORY_TRACT | 2 refills | Status: DC

## 2016-07-22 ENCOUNTER — Telehealth: Payer: Self-pay | Admitting: Allergy and Immunology

## 2016-07-22 NOTE — Telephone Encounter (Signed)
Reviewed chart. I do not feel comfortable giving steroids over the phone since she might need more emergent treatments, but I would be happy to see her tomorrow. It looks like like I have a 1:30 New Patient slot she could be put into. In the meantime, continue with her Advair. She can increase albuterol to four puffs every 4 hours until then. If she is having worsening symptoms, she should go to Urgent Care or her PCP.   Thanks, Malachi BondsJoel Gallagher, MD [facility]

## 2016-07-22 NOTE — Telephone Encounter (Signed)
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 She started having coughing and sinus problems last week. She let it go and it has now turned into wheezing. Would like advice on how to treat. She would like to be seen today, but we have no appointments.

## 2016-07-22 NOTE — Telephone Encounter (Signed)
Please advise 

## 2016-07-22 NOTE — Telephone Encounter (Signed)
Spoke with pt and she is making an appointment  To be seen tomorrow.

## 2016-07-23 ENCOUNTER — Encounter: Payer: Self-pay | Admitting: Allergy & Immunology

## 2016-07-23 ENCOUNTER — Ambulatory Visit (INDEPENDENT_AMBULATORY_CARE_PROVIDER_SITE_OTHER): Payer: 59 | Admitting: Allergy & Immunology

## 2016-07-23 VITALS — BP 150/98 | HR 98 | Temp 98.2°F | Resp 20 | Ht 63.0 in | Wt 195.0 lb

## 2016-07-23 DIAGNOSIS — J4541 Moderate persistent asthma with (acute) exacerbation: Secondary | ICD-10-CM

## 2016-07-23 DIAGNOSIS — J310 Chronic rhinitis: Secondary | ICD-10-CM | POA: Diagnosis not present

## 2016-07-23 DIAGNOSIS — J4530 Mild persistent asthma, uncomplicated: Secondary | ICD-10-CM | POA: Diagnosis not present

## 2016-07-23 MED ORDER — BENZONATATE 200 MG PO CAPS: 200.0000 mg | ORAL_CAPSULE | Freq: Three times a day (TID) | ORAL | 0 refills | Status: AC | PRN

## 2016-07-23 MED FILL — BENZONATATE 200 MG CAPSULE: 200 | 14 days supply | Qty: 42 | Fill #0

## 2016-07-23 NOTE — Patient Instructions (Addendum)
1. Moderate persistent asthma with acute exacerbation - Continue Advair two puffs twice daily for the next two weeks. - Start the prednisone pack that we provided today.  - Continue with albuterol four puffs every 4-6 hours as needed.  2. Rhinitis, non-allergic - Continue with cetirizine 10mg  daily. - Continue with Dymista 1-2 sprays per nostril twice daily.  3. Return in about 3 months (around 10/23/2016).  Please inform us of any Emergency Department visits, hospitalizations, or changes in symptoms. Call us before going to the ED for breathing or allergy symptoms since we might be able to fit you in for a sick visit. Feel free to contact us anytime with any questions, problems, or concerns.  It was a pleasure to meet you today!   Websites that have reliable patient information: 1. American Academy of Asthma, Allergy, and Immunology: www.aaaai.org 2. Food Allergy Research and Education (FARE): foodallergy.org 3. Mothers of Asthmatics: http://www.asthmacommunitynetwork.org 4. American College of Allergy, Asthma, and Immunology: www.acaai.org

## 2016-07-23 NOTE — Progress Notes (Signed)
FOLLOW UP  Date of Service/Encounter:  07/23/16   Assessment:   Moderate persistent asthma with acute exacerbation  Rhinitis, non-allergic    Asthma Reportables:  Severity: moderate persistent  Risk: low Control: well controlled  Seasonal Influenza Vaccine: no but encouraged     Plan/Recommendations:   1. Moderate persistent asthma with acute exacerbation - Continue Advair two puffs twice daily for the next two weeks. - Start the prednisone pack that we provided today.  - Continue with albuterol four puffs every 4-6 hours as needed.  2. Rhinitis, non-allergic - Continue with cetirizine 10mg  daily. - Continue with Dymista 1-2 sprays per nostril twice daily.  3. Return in about 3 months (around 10/23/2016).   Subjective:   April Holloway is a 55 y.o. female presenting today for evaluation of  Chief Complaint  Patient presents with  . Asthma  . Allergic Rhinitis   .  April Ponderina K Queenan has a history of the following: There are no active problems to display for this patient.   History obtained from: chart review and patient.  April Ponderina K Dehn was referred by Frederich ChickWEBB, CAROL D, MD.     [name] is a 55 y.o. female presenting for an asthma exacerbation. She was last seen in May 2017 by Dr. Willa RoughHicks, who has since left the practice. At that time, she was continued on Advair, Singulair, Dymista, and Zyrtec. She was asked to use nasal saline rinses every evening. During worsening respiratory symptoms, she increases her Advair to twice daily. Her last environmental allergy testing was performed in December 2009 and was negative to the entire panel.  Since last visit, she has done well but presents today with a sick visit. She has been sick for around one week with increased wheezing and coughing. She is needing her albuterol around 4-5 times per day. She did increase her Advair 2 puffs twice daily (115/21). She does use a spacer. She has not had a fever. Albuterol does provide some  temporary relief. She has had no nasal congestion but did initially. She is taking sinus medications (she is taking Tylenol Cold and Sinus and Nyquil). She is not having sinus pressure now. She remains on her Dymista and cetirizine. [name] typically requires steroids around once per year, possibly twice per year at the most. She feels that her asthma is under pretty good control.   There have been no changes to her past medical history, family history, or social history. 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    Review of Systems: a 14-point review of systems is pertinent for what is mentioned in HPI.  Otherwise, all other systems were negative. Constitutional: negative other than that listed in the HPI Eyes: negative other than that listed in the HPI Ears, nose, mouth, throat, and face: negative other than that listed in the HPI Respiratory: negative other than that listed in the HPI Cardiovascular: negative other than that listed in the HPI Gastrointestinal: negative other than that listed in the HPI Genitourinary: negative other than that listed in the HPI Integument: negative other than that listed in the HPI Hematologic: negative other than that listed in the HPI Musculoskeletal: negative other than that listed in the HPI Neurological: negative other than that listed in the HPI Allergy/Immunologic: negative other than that listed in the HPI    Objective:   Blood pressure (!) 150/98, pulse 98, temperature 98.2 F (36.8 C), temperature source Oral, resp. rate 20, height 5\' 3"  (1.6 m), weight 195  lb (88.5 kg), SpO2 96 %. Body mass index is 34.54 kg/m.   Physical Exam:  General: Alert, interactive, in no acute distress. Able to talk in sentences but breathing very heavily and fast.  HEENT: TMs pearly gray, turbinates edematous without discharge, post-pharynx mildly erythematous. Neck: Supple without thyromegaly. Adenopathy: no enlarged lymph nodes appreciated in the anterior  cervical, occipital, axillary, epitrochlear, inguinal, or popliteal regions Lungs: Decreased breath sounds with expiratory wheezing bilaterally. Increased work of breathing. CV: Normal S1, S2 without murmurs. Capillary refill <2 seconds.  Abdomen: Nondistended, nontender. Skin: Warm and dry, without lesions or rashes. Extremities:  No clubbing, cyanosis or edema. Neuro:   Grossly intact.  Diagnostic studies:  Spirometry: results normal (FEV1: 2.12/86%, FVC: 2.55/85%, FEV1/FVC: 83%).    Spirometry consistent with normal pattern. We did give a DuoNeb treatment in clinic with a 7% increase in both the FEV1 and FVC. She did sound improved after the treatment but continued to have expiratory wheezing throughout.   Allergy Studies: None     [name], MD [facility]

## 2016-07-28 ENCOUNTER — Other Ambulatory Visit: Payer: Self-pay | Admitting: Obstetrics and Gynecology

## 2016-07-28 ENCOUNTER — Encounter: Payer: Self-pay | Admitting: Allergy & Immunology

## 2016-07-28 ENCOUNTER — Telehealth: Payer: Self-pay | Admitting: Obstetrics and Gynecology

## 2016-07-28 ENCOUNTER — Ambulatory Visit (INDEPENDENT_AMBULATORY_CARE_PROVIDER_SITE_OTHER): Payer: 59 | Admitting: Allergy & Immunology

## 2016-07-28 VITALS — BP 120/75 | HR 80 | Temp 98.3°F | Resp 17

## 2016-07-28 DIAGNOSIS — J310 Chronic rhinitis: Secondary | ICD-10-CM | POA: Diagnosis not present

## 2016-07-28 DIAGNOSIS — Z1231 Encounter for screening mammogram for malignant neoplasm of breast: Secondary | ICD-10-CM

## 2016-07-28 DIAGNOSIS — J4541 Moderate persistent asthma with (acute) exacerbation: Secondary | ICD-10-CM | POA: Diagnosis not present

## 2016-07-28 MED ORDER — VALACYCLOVIR HCL 1 G PO TABS: 1000.0000 mg | ORAL_TABLET | Freq: Every day | ORAL | 4 refills | Status: AC

## 2016-07-28 MED FILL — valACYclovir HCL 1 GM TABS: 1 | 5 days supply | Qty: 5 | Fill #0

## 2016-07-28 NOTE — Telephone Encounter (Signed)
Ok. Will send in prescription with several refills.  Please verify that she takes 1 gram daily x 5 days per occurrence.    Hildred LaserAnika Latoshia Monrroy, MD [facility]

## 2016-07-28 NOTE — Progress Notes (Signed)
FOLLOW UP  Date of Service/Encounter:  07/28/16   Assessment:   Moderate persistent asthma with acute exacerbation  Rhinitis, non-allergic   Asthma Reportables:  Severity: moderate persistent  Risk: low Control: well controlled  Seasonal Influenza Vaccine: yes    Plan/Recommendations:   1. Moderate persistent asthma with acute exacerbation - Continue Advair two puffs twice daily for the next two weeks. - We will extend your course of prednisone: 60mg  daily for three doses, 40mg  daily for three doses, 20mg  daily for three doses, 20mg  every other day for three doses, and then stop - Continue with Xopenex four puffs every 4-6 hours as needed.  2. Rhinitis, non-allergic - Continue with cetirizine 10mg  daily. - Continue with Dymista 1-2 sprays per nostril twice daily. - Patient does not feel that her nasal congestion has worsened, in fact has improved. - She prefers to hold off on an antibiotic at this time. - I did encourage her to call us back next week if she has worsening symptoms.  3. Return in about 3 months (around 10/28/2016).     Subjective:   April Holloway is a 55 y.o. female presenting today for follow up of  Chief Complaint  Patient presents with  . Cough  . Wheezing  . Nasal Congestion  .  April Holloway has a history of the following: There are no active problems to display for this patient.   History obtained from: chart review and patient.  April Holloway was referred by Frederich ChickWEBB, CAROL D, MD.     April Holloway is a 55 y.o. female presenting for a follow up visit for cough. April Holloway was last seen 5 days ago for an asthma exacerbation. At that time, she was continued on her Advair (115/21) 2 puffs twice daily and was started on a prednisone pack. She has a history of nonallergic rhinitis and is on Dymista 1-2 sprays per nostril twice daily as well as cetirizine 10 mg daily.  Since the last visit, she has continued to wheeze. She finished the prednisone yesterday  and continues to have problems with catching her breath. She does feel good now, but she has taken her albuterol as well as Mucinex this morning. She does not have a fever and her nasal discharge has improved. She continues to take her rescue inhaler as recommended and does have a spacer at home. She is going to the beach this weekend with her husband and wants to make sure she does not have problems.   Otherwise, there have been no changes to the past medical history, surgical history, family history, or social history.     Review of Systems: a 14-point review of systems is pertinent for what is mentioned in HPI.  Otherwise, all other systems were negative. Constitutional: negative other than that listed in the HPI Eyes: negative other than that listed in the HPI Ears, nose, mouth, throat, and face: negative other than that listed in the HPI Respiratory: negative other than that listed in the HPI Cardiovascular: negative other than that listed in the HPI Gastrointestinal: negative other than that listed in the HPI Genitourinary: negative other than that listed in the HPI Integument: negative other than that listed in the HPI Hematologic: negative other than that listed in the HPI Musculoskeletal: negative other than that listed in the HPI Neurological: negative other than that listed in the HPI Allergy/Immunologic: negative other than that listed in the HPI    Objective:   Blood pressure 120/75, pulse 80, temperature  98.3 F (36.8 C), temperature source Oral, resp. rate 17. There is no height or weight on file to calculate BMI.   Physical Exam:  General: Alert, interactive, in no acute distress. Cooperative with the exam. HEENT: TMs pearly gray, turbinates edematous and pale without discharge, post-pharynx mildly erythematous. Neck: Supple without thyromegaly. Lungs: Decreased breath sounds bilaterally without wheezing, rhonchi or rales. No increased work of breathing. CV: Normal  S1, S2 without murmurs. Capillary refill <2 seconds.  Abdomen: Nondistended, nontender. Skin: Warm and dry, without lesions or rashes. Extremities:  No clubbing, cyanosis or edema. Neuro:   Grossly intact.   Diagnostic studies: None    [name], MD [facility]

## 2016-07-28 NOTE — Telephone Encounter (Signed)
YES 1 GRAM Q DAY X 5

## 2016-07-28 NOTE — Telephone Encounter (Signed)
Pt called and is needing Valtrex. She has taken this in the past but you have never prescribed it. The main [facility]

## 2016-07-28 NOTE — Patient Instructions (Addendum)
1. Moderate persistent asthma with acute exacerbation - Continue Advair two puffs twice daily for the next two weeks. - We will extend your course of prednisone: 60mg  daily for three doses, 40mg  daily for three doses, 20mg  daily for three doses, 20mg  every other day for three doses, and then stop - Continue with Xopenex four puffs every 4-6 hours as needed.  2. Rhinitis, non-allergic - Continue with cetirizine 10mg  daily. - Continue with Dymista 1-2 sprays per nostril twice daily.  3. Return in about 3 months (around 10/28/2016).  Please inform us of any Emergency Department visits, hospitalizations, or changes in symptoms. Call us before going to the ED for breathing or allergy symptoms since we might be able to fit you in for a sick visit. Feel free to contact us anytime with any questions, problems, or concerns.  It was a pleasure to see you again today!   Websites that have reliable patient information: 1. American Academy of Asthma, Allergy, and Immunology: www.aaaai.org 2. Food Allergy Research and Education (FARE): foodallergy.org 3. Mothers of Asthmatics: http://www.asthmacommunitynetwork.org 4. American College of Allergy, Asthma, and Immunology: www.acaai.org

## 2016-08-04 ENCOUNTER — Other Ambulatory Visit
Admission: RE | Admit: 2016-08-04 | Discharge: 2016-08-04 | Disposition: A | Discharge status: Home / Self Care | Payer: 59 | Source: Ambulatory Visit | Attending: Nurse Practitioner | Admitting: Nurse Practitioner

## 2016-08-04 DIAGNOSIS — R110 Nausea: Secondary | ICD-10-CM | POA: Diagnosis not present

## 2016-08-04 DIAGNOSIS — R1013 Epigastric pain: Secondary | ICD-10-CM | POA: Insufficient documentation

## 2016-08-04 LAB — BASIC METABOLIC PANEL
Anion gap: 11 (ref 5–15)
BUN: 18 mg/dL (ref 6–20)
CO2: 28 mmol/L (ref 22–32)
Calcium: 9.8 mg/dL (ref 8.9–10.3)
Chloride: 97 mmol/L — ABNORMAL LOW (ref 101–111)
Creatinine, Ser: 0.79 mg/dL (ref 0.44–1.00)
GFR calc Af Amer: >60 mL/min (ref >60)
GFR calc non Af Amer: >60 mL/min (ref >60)
Glucose, Bld: 114 mg/dL — ABNORMAL HIGH (ref 65–99)
Potassium: 3.4 mmol/L — ABNORMAL LOW (ref 3.5–5.1)
Sodium: 136 mmol/L (ref 135–145)

## 2016-08-04 LAB — CBC WITH DIFFERENTIAL/PLATELET
Basophils Absolute: 0 10*3/uL (ref 0–0.1)
Basophils Relative: 0 %
Eosinophils Absolute: 0 10*3/uL (ref 0–0.7)
Eosinophils Relative: 0 %
HCT: 42.1 % (ref 35.0–47.0)
Hemoglobin: 13.8 g/dL (ref 12.0–16.0)
Lymphocytes Relative: 8 %
Lymphs Abs: 0.9 10*3/uL — ABNORMAL LOW (ref 1.0–3.6)
MCH: 26.8 pg (ref 26.0–34.0)
MCHC: 32.7 g/dL (ref 32.0–36.0)
MCV: 81.9 fL (ref 80.0–100.0)
Monocytes Absolute: 0.3 10*3/uL (ref 0.2–0.9)
Monocytes Relative: 3 %
Neutro Abs: 10.5 10*3/uL — ABNORMAL HIGH (ref 1.4–6.5)
Neutrophils Relative %: 89 %
Platelets: 312 10*3/uL (ref 150–440)
RBC: 5.14 MIL/uL (ref 3.80–5.20)
RDW: 15.3 % — ABNORMAL HIGH (ref 11.5–14.5)
WBC: 11.7 10*3/uL — ABNORMAL HIGH (ref 3.6–11.0)

## 2016-08-04 LAB — HEPATIC FUNCTION PANEL
ALT: 29 U/L (ref 14–54)
AST: 26 U/L (ref 15–41)
Albumin: 4.1 g/dL (ref 3.5–5.0)
Alkaline Phosphatase: 75 U/L (ref 38–126)
Bilirubin, Direct: <0.1 mg/dL — ABNORMAL LOW (ref 0.1–0.5)
Total Bilirubin: 0.9 mg/dL (ref 0.3–1.2)
Total Protein: 6.9 g/dL (ref 6.5–8.1)

## 2016-08-08 DIAGNOSIS — I10 Essential (primary) hypertension: Secondary | ICD-10-CM | POA: Diagnosis not present

## 2016-08-08 DIAGNOSIS — R5383 Other fatigue: Secondary | ICD-10-CM | POA: Diagnosis not present

## 2016-08-08 DIAGNOSIS — E876 Hypokalemia: Secondary | ICD-10-CM | POA: Diagnosis not present

## 2016-08-08 MED FILL — SPIRONOLACTONE 25 MG TABLET: 25 | 30 days supply | Qty: 30 | Fill #0

## 2016-08-08 MED FILL — KLOR-CON M10 TABLET: 10 | 3 days supply | Qty: 6 | Fill #0

## 2016-08-11 DIAGNOSIS — R109 Unspecified abdominal pain: Secondary | ICD-10-CM | POA: Diagnosis not present

## 2016-08-11 MED FILL — SUMATRIPTAN SUCC 100 MG TAB: 100 | 30 days supply | Qty: 9 | Fill #0

## 2016-08-15 DIAGNOSIS — R5383 Other fatigue: Secondary | ICD-10-CM | POA: Diagnosis not present

## 2016-08-15 DIAGNOSIS — I10 Essential (primary) hypertension: Secondary | ICD-10-CM | POA: Diagnosis not present

## 2016-08-15 MED FILL — valACYclovir HCL 1 GM TABS: 1 | 5 days supply | Qty: 5 | Fill #1

## 2016-08-15 MED FILL — QUETIAPINE FUMARATE 100 MG: 100 | 90 days supply | Qty: 90 | Fill #0

## 2016-08-21 ENCOUNTER — Other Ambulatory Visit: Payer: Self-pay | Admitting: Obstetrics and Gynecology

## 2016-08-21 ENCOUNTER — Ambulatory Visit
Admission: RE | Admit: 2016-08-21 | Discharge: 2016-08-21 | Disposition: A | Discharge status: Home / Self Care | Payer: 59 | Source: Ambulatory Visit | Attending: Obstetrics and Gynecology | Admitting: Obstetrics and Gynecology

## 2016-08-21 DIAGNOSIS — Z1231 Encounter for screening mammogram for malignant neoplasm of breast: Secondary | ICD-10-CM | POA: Diagnosis not present

## 2016-08-25 ENCOUNTER — Other Ambulatory Visit: Payer: Self-pay | Admitting: Obstetrics and Gynecology

## 2016-08-25 DIAGNOSIS — N6320 Unspecified lump in the left breast, unspecified quadrant: Secondary | ICD-10-CM

## 2016-08-27 ENCOUNTER — Other Ambulatory Visit: Payer: Self-pay | Admitting: Obstetrics and Gynecology

## 2016-08-27 DIAGNOSIS — R928 Other abnormal and inconclusive findings on diagnostic imaging of breast: Secondary | ICD-10-CM

## 2016-08-28 DIAGNOSIS — F331 Major depressive disorder, recurrent, moderate: Secondary | ICD-10-CM | POA: Diagnosis not present

## 2016-08-28 DIAGNOSIS — F419 Anxiety disorder, unspecified: Secondary | ICD-10-CM | POA: Diagnosis not present

## 2016-08-28 MED FILL — DULoxetine HCL 30 MG CPEP: 30 | 90 days supply | Qty: 270 | Fill #0

## 2016-08-29 ENCOUNTER — Other Ambulatory Visit: Payer: Self-pay | Admitting: Obstetrics and Gynecology

## 2016-08-29 ENCOUNTER — Telehealth: Payer: Self-pay | Admitting: Obstetrics and Gynecology

## 2016-08-29 NOTE — Telephone Encounter (Signed)
Yes.  I had originally placed the orders I thought they would need.  Just got a message back from them yesterday requesting that I place a different order.  I have done that today.  Please inform the patient.

## 2016-08-29 NOTE — Telephone Encounter (Signed)
Lm for pt letting her know.

## 2016-08-29 NOTE — Telephone Encounter (Signed)
PT HAS CALLED AND SHE HAS CALLED HERE AND NOW [facility] IS CALLING US AND SHE IS NEEDING ORDERS SIGNED SO THAT SHE CAN SCHEDULE ADDITIONAL MAMMOS, IM NOT SURE IF THIS IS SOMETHING THAT CAN BE SIGNED OFF IN EPIC OR WHAT, BUT PT IS GETTING A LITTLE FLUTTERED. NOT SURE IF MESSAGE HAS BEEN SENT TO [facility] THIS WEEK OR NOT.THANKS Tenae

## 2016-09-02 MED FILL — MONTELUKAST SOD 10 MG TAB: 10 | 30 days supply | Qty: 30 | Fill #0

## 2016-09-02 MED FILL — SUCRALFATE 1 GM TABLET: 1 | 30 days supply | Qty: 90 | Fill #0

## 2016-09-02 MED FILL — SPIRONOLACTONE 25 MG TABLET: 25 | 30 days supply | Qty: 30 | Fill #1

## 2016-09-02 MED FILL — TOVIAZ ER 8 MG TABLET: 8 | 30 days supply | Qty: 30 | Fill #0

## 2016-09-05 ENCOUNTER — Encounter: Payer: Self-pay | Admitting: *Deleted

## 2016-09-08 ENCOUNTER — Encounter: Payer: Self-pay | Admitting: *Deleted

## 2016-09-08 ENCOUNTER — Ambulatory Visit: Payer: 59 | Admitting: Anesthesiology

## 2016-09-08 ENCOUNTER — Encounter
Admission: RE | Disposition: A | Discharge status: Home / Self Care | Payer: Self-pay | Source: Ambulatory Visit | Attending: Unknown Physician Specialty

## 2016-09-08 ENCOUNTER — Ambulatory Visit
Admission: RE | Admit: 2016-09-08 | Discharge: 2016-09-08 | Disposition: A | Discharge status: Home / Self Care | Payer: 59 | Source: Ambulatory Visit | Attending: Unknown Physician Specialty | Admitting: Unknown Physician Specialty

## 2016-09-08 DIAGNOSIS — Z9889 Other specified postprocedural states: Secondary | ICD-10-CM | POA: Diagnosis not present

## 2016-09-08 DIAGNOSIS — R1011 Right upper quadrant pain: Secondary | ICD-10-CM | POA: Diagnosis not present

## 2016-09-08 DIAGNOSIS — Z975 Presence of (intrauterine) contraceptive device: Secondary | ICD-10-CM | POA: Insufficient documentation

## 2016-09-08 DIAGNOSIS — Z7951 Long term (current) use of inhaled steroids: Secondary | ICD-10-CM | POA: Insufficient documentation

## 2016-09-08 DIAGNOSIS — N3281 Overactive bladder: Secondary | ICD-10-CM | POA: Insufficient documentation

## 2016-09-08 DIAGNOSIS — Z6833 Body mass index (BMI) 33.0-33.9, adult: Secondary | ICD-10-CM | POA: Diagnosis not present

## 2016-09-08 DIAGNOSIS — K3189 Other diseases of stomach and duodenum: Secondary | ICD-10-CM | POA: Diagnosis not present

## 2016-09-08 DIAGNOSIS — R1310 Dysphagia, unspecified: Secondary | ICD-10-CM | POA: Insufficient documentation

## 2016-09-08 DIAGNOSIS — K2950 Unspecified chronic gastritis without bleeding: Secondary | ICD-10-CM | POA: Insufficient documentation

## 2016-09-08 DIAGNOSIS — J45909 Unspecified asthma, uncomplicated: Secondary | ICD-10-CM | POA: Diagnosis not present

## 2016-09-08 DIAGNOSIS — Z79899 Other long term (current) drug therapy: Secondary | ICD-10-CM | POA: Insufficient documentation

## 2016-09-08 DIAGNOSIS — G4730 Sleep apnea, unspecified: Secondary | ICD-10-CM | POA: Diagnosis not present

## 2016-09-08 DIAGNOSIS — E669 Obesity, unspecified: Secondary | ICD-10-CM | POA: Insufficient documentation

## 2016-09-08 DIAGNOSIS — Z8049 Family history of malignant neoplasm of other genital organs: Secondary | ICD-10-CM | POA: Diagnosis not present

## 2016-09-08 DIAGNOSIS — Z87891 Personal history of nicotine dependence: Secondary | ICD-10-CM | POA: Insufficient documentation

## 2016-09-08 DIAGNOSIS — Z8261 Family history of arthritis: Secondary | ICD-10-CM | POA: Diagnosis not present

## 2016-09-08 DIAGNOSIS — I10 Essential (primary) hypertension: Secondary | ICD-10-CM | POA: Insufficient documentation

## 2016-09-08 DIAGNOSIS — Z8249 Family history of ischemic heart disease and other diseases of the circulatory system: Secondary | ICD-10-CM | POA: Insufficient documentation

## 2016-09-08 DIAGNOSIS — R1013 Epigastric pain: Secondary | ICD-10-CM | POA: Diagnosis not present

## 2016-09-08 DIAGNOSIS — K2960 Other gastritis without bleeding: Secondary | ICD-10-CM | POA: Diagnosis not present

## 2016-09-08 DIAGNOSIS — Z9049 Acquired absence of other specified parts of digestive tract: Secondary | ICD-10-CM | POA: Insufficient documentation

## 2016-09-08 DIAGNOSIS — Z808 Family history of malignant neoplasm of other organs or systems: Secondary | ICD-10-CM | POA: Insufficient documentation

## 2016-09-08 DIAGNOSIS — K2970 Gastritis, unspecified, without bleeding: Secondary | ICD-10-CM | POA: Diagnosis not present

## 2016-09-08 DIAGNOSIS — R110 Nausea: Secondary | ICD-10-CM | POA: Diagnosis present

## 2016-09-08 HISTORY — PX: ESOPHAGOGASTRODUODENOSCOPY (EGD) WITH PROPOFOL: SHX5813

## 2016-09-08 HISTORY — DX: Headache: R51

## 2016-09-08 HISTORY — DX: Headache, unspecified: R51.9

## 2016-09-08 HISTORY — DX: Sleep apnea, unspecified: G47.30

## 2016-09-08 SURGERY — ESOPHAGOGASTRODUODENOSCOPY (EGD) WITH PROPOFOL
Anesthesia: General

## 2016-09-08 MED ORDER — FENTANYL CITRATE (PF) 100 MCG/2ML IJ SOLN
INTRAMUSCULAR | Status: DC | PRN
  Administered 2016-09-08: 50 ug via INTRAVENOUS

## 2016-09-08 MED ORDER — PROPOFOL 10 MG/ML IV BOLUS
INTRAVENOUS | Status: DC | PRN
  Administered 2016-09-08 (×2): 20 mg via INTRAVENOUS
  Administered 2016-09-08: 110 mg via INTRAVENOUS
  Administered 2016-09-08 (×2): 20 mg via INTRAVENOUS

## 2016-09-08 MED ORDER — SODIUM CHLORIDE 0.9 % IV SOLN: INTRAVENOUS | Status: DC

## 2016-09-08 MED ORDER — GLYCOPYRROLATE 0.2 MG/ML IJ SOLN
INTRAMUSCULAR | Status: DC | PRN
  Administered 2016-09-08: 0.2 mg via INTRAVENOUS

## 2016-09-08 MED ORDER — SODIUM CHLORIDE 0.9 % IV SOLN
INTRAVENOUS | Status: DC
  Administered 2016-09-08: 10:00:00 via INTRAVENOUS

## 2016-09-08 NOTE — Op Note (Signed)
[facility] Gastroenterology Patient Name: April Holloway Hentz Procedure Date: 09/08/2016 9:47 AM MRN: [id number] Account #: [id number] Date of Birth: 1961/09/02 Admit Type: Outpatient Age: [age] [facility] Gender: Female Note Status: Finalized Procedure:            Upper GI endoscopy Indications:          Abdominal pain in the right upper quadrant, Dysphagia Providers:            Scot Junobert T. Elliott, MD Referring MD:         Shirlean Mylararol Webb (Referring MD) Medicines:            Propofol per Anesthesia Complications:        No immediate complications. Procedure:            Pre-Anesthesia Assessment:                       - After reviewing the risks and benefits, the patient                        was deemed in satisfactory condition to undergo the                        procedure.                       After obtaining informed consent, the endoscope was                        passed under direct vision. Throughout the procedure,                        the patient's blood pressure, pulse, and oxygen                        saturations were monitored continuously. The                        Colonoscope was introduced through the mouth, and                        advanced to the second part of duodenum. The upper GI                        endoscopy was accomplished without difficulty. The                        patient tolerated the procedure well. Findings:      The examined esophagus was normal. Due to complaint of dysphagia A       guidewire was placed and the scope was withdrawn. Dilation was performed       with a Savary dilator with mild resistance at 16 mm.      Diffuse mildly erythematous mucosa without bleeding was found in the       gastric antrum. Biopsies were taken with a cold forceps for histology.       Biopsies were taken with a cold forceps for Helicobacter pylori testing.       Biopsies done of distal body also.      The duodenal bulb and first portion  of the duodenum were normal. Due to  RUQ abdominal pain Biopsies were taken with a cold forceps for histology       from bulb and second portion. Impression:           - Normal esophagus. Dilated.                       - Erythematous mucosa in the antrum. Biopsied.                       - Normal duodenal bulb and first portion of the                        duodenum. Biopsied. Recommendation:       - Await pathology results. Scot Junobert T Elliott, MD 09/08/2016 10:07:25 AM This report has been signed electronically. Number of Addenda: 0 Note Initiated On: 09/08/2016 9:47 AM      [facility]

## 2016-09-08 NOTE — H&P (Signed)
Primary Care Physician:  Frederich ChickWEBB, CAROL D, MD Primary Gastroenterologist:  Dr. Mechele CollinElliott  Pre-Procedure History & Physical: HPI:  April Holloway is a 55 y.o. female is here for an endoscopy.   Past Medical History:  Diagnosis Date  . Asthma   . Headache    Migraines  . HSV infection   . Hypertension   . Insomnia   . Obesity   . Overactive bladder   . Seasonal allergies   . Sleep apnea     Past Surgical History:  Procedure Laterality Date  . APPENDECTOMY    . BREAST BIOPSY Right 2001  . EYE SURGERY    . KNEE ARTHROPLASTY Left   . SHOULDER ARTHROSCOPY Bilateral   . TONSILLECTOMY    . WRIST SURGERY Right     Prior to Admission medications   Medication Sig Start Date End Date Taking? Authorizing Provider  cetirizine (ZYRTEC) 10 MG tablet Take 10 mg by mouth daily.   Yes Historical Provider, MD  Cholecalciferol (VITAMIN D3) 5000 units CAPS Take 5,000 Units by mouth daily.   Yes Historical Provider, MD  DULoxetine (CYMBALTA) 30 MG capsule Reported on 02/28/2016 01/17/16  Yes Historical Provider, MD  DYMISTA 137-50 MCG/ACT SUSP  06/21/15  Yes Historical Provider, MD  esomeprazole (NEXIUM) 20 MG capsule Take 20 mg by mouth daily.   Yes Historical Provider, MD  fluticasone-salmeterol (ADVAIR HFA) 115-21 MCG/ACT inhaler Inhale 2 puffs into the lungs 2 (two) times daily. 07/16/16  Yes Alfonse SpruceJoel Louis Gallagher, MD  Melatonin 10 MG TABS Take by mouth.   Yes Historical Provider, MD  montelukast (SINGULAIR) 10 MG tablet TAKE 1 TABLET BY MOUTH AT BEDTIME 03/25/16  Yes Roselyn Kara MeadM Hicks, MD  QUEtiapine (SEROQUEL) 100 MG tablet  07/09/15  Yes Historical Provider, MD  sucralfate (CARAFATE) 1 g tablet Take 1 g by mouth 3 (three) times daily. 06/26/16  Yes Historical Provider, MD  hydrochlorothiazide (HYDRODIURIL) 25 MG tablet  07/02/15   Historical Provider, MD  levalbuterol (XOPENEX HFA) 45 MCG/ACT inhaler Inhale 2 puffs into the lungs every 6 (six) hours as needed for wheezing.    Historical Provider, MD   TOVIAZ 8 MG TB24 tablet  07/09/15   Historical Provider, MD  valACYclovir (VALTREX) 1000 MG tablet Take 1 tablet (1,000 mg total) by mouth daily. 07/28/16   Hildred LaserAnika Cherry, MD    Allergies as of 07/10/2016  . (No Known Allergies)    Family History  Problem Relation Age of Onset  . Cancer Mother   . Uterine cancer Mother   . Hypertension Mother   . Fibromyalgia Mother   . Cancer Father     Melanoma  . Hypertension Father   . Arthritis Father   . Breast cancer Neg Hx     Social History   Social History  . Marital status: Married    Spouse name: N/A  . Number of children: N/A  . Years of education: N/A   Occupational History  . Not on file.   Social History Main Topics  . Smoking status: Former Smoker    Quit date: 10/20/1992  . Smokeless tobacco: Never Used  . Alcohol use No  . Drug use: No  . Sexual activity: Yes    Birth control/ protection: IUD   Other Topics Concern  . Not on file   Social History Narrative  . No narrative on file    Review of Systems: See HPI, otherwise negative ROS  Physical Exam: BP 121/77   Pulse 75  Temp (!) 96.8 F (36 C) (Tympanic)   Resp 18   Ht 5\' 3"  (1.6 m)   Wt 85.3 kg (188 lb)   SpO2 98%   BMI 33.30 kg/m  General:   Alert,  pleasant and cooperative in NAD Head:  Normocephalic and atraumatic. Neck:  Supple; no masses or thyromegaly. Lungs:  Clear throughout to auscultation.    Heart:  Regular rate and rhythm. Abdomen:  Soft, nontender and nondistended. Normal bowel sounds, without guarding, and without rebound.   Neurologic:  Alert and  oriented x4;  grossly normal neurologically.  Impression/Plan: April Holloway is here for an endoscopy to be performed for  RUQ abd pain.  Risks, benefits, limitations, and alternatives regarding  endoscopy have been reviewed with the patient.  Questions have been answered.  All parties agreeable.   Lynnae PrudeELLIOTT, Esteven Overfelt, MD  09/08/2016, 9:42 AM

## 2016-09-08 NOTE — Transfer of Care (Signed)
Immediate Anesthesia Transfer of Care Note  Patient: April Holloway  Procedure(s) Performed: Procedure(s): ESOPHAGOGASTRODUODENOSCOPY (EGD) WITH PROPOFOL (N/A)  Patient Location: PACU  Anesthesia Type:General  Level of Consciousness: awake, alert , oriented and patient cooperative  Airway & Oxygen Therapy: Patient Spontanous Breathing and Patient connected to nasal cannula oxygen  Post-op Assessment: Report given to RN and Post -op Vital signs reviewed and stable  Post vital signs: Reviewed and stable  Last Vitals:  Vitals:   09/08/16 0927 09/08/16 1007  BP: 121/77 108/81  Pulse: 75 84  Resp: 18 16  Temp: (!) 36 C 36.5 C    Last Pain:  Vitals:   09/08/16 1007  TempSrc: Tympanic         Complications: No apparent anesthesia complications

## 2016-09-08 NOTE — Anesthesia Postprocedure Evaluation (Signed)
Anesthesia Post Note  Patient: April Holloway  Procedure(s) Performed: Procedure(s) (LRB): ESOPHAGOGASTRODUODENOSCOPY (EGD) WITH PROPOFOL (N/A)  Patient location during evaluation: PACU Anesthesia Type: General Level of consciousness: awake and alert Pain management: pain level controlled Vital Signs Assessment: post-procedure vital signs reviewed and stable Respiratory status: spontaneous breathing, nonlabored ventilation, respiratory function stable and patient connected to nasal cannula oxygen Cardiovascular status: blood pressure returned to baseline and stable Postop Assessment: no signs of nausea or vomiting Anesthetic complications: no    Last Vitals:  Vitals:   09/08/16 1027 09/08/16 1037  BP: 107/74 117/71  Pulse: 76 66  Resp: (!) 24 16  Temp:      Last Pain:  Vitals:   09/08/16 1007  TempSrc: Tympanic                 Yevette EdwardsJames G Adams

## 2016-09-08 NOTE — Anesthesia Preprocedure Evaluation (Signed)
Anesthesia Evaluation  Patient identified by MRN, date of birth, ID band Patient awake    Reviewed: Allergy & Precautions, H&P , NPO status , Patient's Chart, lab work & pertinent test results, reviewed documented beta blocker date and time   Airway Mallampati: II   Neck ROM: full    Dental  (+) Teeth Intact   Pulmonary neg pulmonary ROS, asthma , sleep apnea , former smoker,    Pulmonary exam normal        Cardiovascular hypertension, negative cardio ROS Normal cardiovascular exam Rhythm:regular Rate:Normal     Neuro/Psych  Headaches,  Neuromuscular disease negative neurological ROS  negative psych ROS   GI/Hepatic negative GI ROS, Neg liver ROS,   Endo/Other  negative endocrine ROS  Renal/GU negative Renal ROS  negative genitourinary   Musculoskeletal   Abdominal   Peds  Hematology negative hematology ROS (+)   Anesthesia Other Findings Past Medical History: No date: Asthma No date: Headache     Comment: Migraines No date: HSV infection No date: Hypertension No date: Insomnia No date: Obesity No date: Overactive bladder No date: Seasonal allergies No date: Sleep apnea Past Surgical History: No date: APPENDECTOMY 2001: BREAST BIOPSY Right No date: EYE SURGERY No date: KNEE ARTHROPLASTY Left No date: SHOULDER ARTHROSCOPY Bilateral No date: TONSILLECTOMY No date: WRIST SURGERY Right BMI    Body Mass Index:  33.30 kg/m     Reproductive/Obstetrics                             Anesthesia Physical Anesthesia Plan  ASA: II  Anesthesia Plan: General   Post-op Pain Management:    Induction:   Airway Management Planned:   Additional Equipment:   Intra-op Plan:   Post-operative Plan:   Informed Consent: I have reviewed the patients History and Physical, chart, labs and discussed the procedure including the risks, benefits and alternatives for the proposed anesthesia with  the patient or authorized representative who has indicated his/her understanding and acceptance.   Dental Advisory Given  Plan Discussed with: CRNA  Anesthesia Plan Comments:         Anesthesia Quick Evaluation

## 2016-09-09 ENCOUNTER — Encounter: Payer: 59 | Admitting: Obstetrics and Gynecology

## 2016-09-09 LAB — SURGICAL PATHOLOGY

## 2016-09-09 MED FILL — QUETIAPINE FUMARATE 50 MG T: 50 | 90 days supply | Qty: 270 | Fill #0

## 2016-09-10 ENCOUNTER — Encounter: Payer: Self-pay | Admitting: Unknown Physician Specialty

## 2016-09-16 ENCOUNTER — Encounter: Payer: Self-pay | Admitting: Obstetrics and Gynecology

## 2016-09-16 ENCOUNTER — Ambulatory Visit (INDEPENDENT_AMBULATORY_CARE_PROVIDER_SITE_OTHER): Payer: 59 | Admitting: Obstetrics and Gynecology

## 2016-09-16 VITALS — BP 111/65 | HR 81 | Ht 63.0 in | Wt 193.9 lb

## 2016-09-16 DIAGNOSIS — E669 Obesity, unspecified: Secondary | ICD-10-CM | POA: Diagnosis not present

## 2016-09-16 DIAGNOSIS — E785 Hyperlipidemia, unspecified: Secondary | ICD-10-CM | POA: Diagnosis not present

## 2016-09-16 DIAGNOSIS — Z01419 Encounter for gynecological examination (general) (routine) without abnormal findings: Secondary | ICD-10-CM

## 2016-09-16 DIAGNOSIS — R928 Other abnormal and inconclusive findings on diagnostic imaging of breast: Secondary | ICD-10-CM | POA: Diagnosis not present

## 2016-09-16 NOTE — Progress Notes (Signed)
GYNECOLOGY ANNUAL EXAM CLINIC PROGRESS NOTE  Subjective:    April Holloway is a 55 y.o. [id number] married female who presents for an annual exam. The patient has no complaints today. The patient is sexually active. GYN screening history: The patient wears seatbelts: yes. The patient participates in regular exercise: not asked. Has the patient ever been transfused or tattooed?: no. The patient reports that there is not domestic violence in her life.   Menstrual History: Menarche age: [age] No LMP recorded. Patient is not currently having periods (Reason: IUD). last pap: approximate date 2016 and was normal. Denies h/o abnormal pap smears or STIs.  last mammogram: approximate date 06/2016 and was abnormal, BIRADS 0 (possible breast mass in left breast). For f/u imaging in  2 days.  Last colonoscopy: ~4 years ago, and was normal  Past Medical History:  Diagnosis Date  . Asthma   . Headache    Migraines  . HSV infection   . Hypertension   . Insomnia   . Obesity   . Overactive bladder   . Seasonal allergies   . Sleep apnea     Past Surgical History:  Procedure Laterality Date  . APPENDECTOMY    . BREAST BIOPSY Right 2001  . ESOPHAGOGASTRODUODENOSCOPY (EGD) WITH PROPOFOL N/A 09/08/2016   Procedure: ESOPHAGOGASTRODUODENOSCOPY (EGD) WITH PROPOFOL;  Surgeon: Scot Junobert T Elliott, MD;  [facility] ENDOSCOPY;  Service: Endoscopy;  Laterality: N/A;  . EYE SURGERY    . KNEE ARTHROPLASTY Left   . SHOULDER ARTHROSCOPY Bilateral   . TONSILLECTOMY    . UPPER GI ENDOSCOPY     Gallbladder issues   . WRIST SURGERY Right     Family History  Problem Relation Age of Onset  . Cancer Mother   . Uterine cancer Mother   . Hypertension Mother   . Fibromyalgia Mother   . Cancer Father     Melanoma  . Hypertension Father   . Arthritis Father   . Breast cancer Neg Hx     Social History   Social History  . Marital status: Married    Spouse name: N/A  . Number of children: N/A  . Years of  education: N/A   Occupational History  . Not on file.   Social History Main Topics  . Smoking status: Former Smoker    Quit date: 10/20/1992  . Smokeless tobacco: Never Used  . Alcohol use No  . Drug use: No  . Sexual activity: Yes    Birth control/ protection: IUD   Other Topics Concern  . Not on file   Social History Narrative  . No narrative on file    Outpatient Encounter Prescriptions as of 09/16/2016  Medication Sig Note  . cetirizine (ZYRTEC) 10 MG tablet Take 10 mg by mouth daily.   . Cholecalciferol (VITAMIN D3) 5000 units CAPS Take 5,000 Units by mouth daily. 07/23/2016: [facility] Received Sig: Take 5,000 Units by mouth once daily.  . DULoxetine (CYMBALTA) 30 MG capsule Reported on 02/28/2016 02/28/2016: Received from: External Pharmacy  . DYMISTA 137-50 MCG/ACT SUSP  09/05/2015: Received from: External Pharmacy  . esomeprazole (NEXIUM) 20 MG capsule Take 20 mg by mouth daily. 07/23/2016: [facility] Received Sig: Take 20 mg by mouth once daily.  . fluticasone-salmeterol (ADVAIR HFA) 115-21 MCG/ACT inhaler Inhale 2 puffs into the lungs 2 (two) times daily.   Marland Kitchen. levalbuterol (XOPENEX HFA) 45 MCG/ACT inhaler Inhale 2 puffs into the lungs every 6 (six)  hours as needed for wheezing.   . Melatonin 10 MG TABS Take by mouth.   . montelukast (SINGULAIR) 10 MG tablet TAKE 1 TABLET BY MOUTH AT BEDTIME   . QUEtiapine (SEROQUEL) 100 MG tablet  09/05/2015: Received from: External Pharmacy  . QUEtiapine (SEROQUEL) 50 MG tablet  09/16/2016: Received from: External Pharmacy  . spironolactone (ALDACTONE) 25 MG tablet Take by mouth. 09/16/2016: Received from: [facility] Received Sig: Take 25 mg by mouth once daily.  . sucralfate (CARAFATE) 1 g tablet Take 1 g by mouth 3 (three) times daily. 07/23/2016: Received from: [facility] Received Sig: Take 1 tablet (1 g total) by mouth 3 (three) times  daily.  . TOVIAZ 8 MG TB24 tablet  09/05/2015: Received from: External Pharmacy  . valACYclovir (VALTREX) 1000 MG tablet Take 1 tablet (1,000 mg total) by mouth daily.   . [DISCONTINUED] esomeprazole (NEXIUM) 20 MG capsule Take by mouth. 09/16/2016: Received from: [facility] Received Sig: Take 1 capsule (20 mg total) by mouth once daily.  . [DISCONTINUED] valACYclovir (VALTREX) 1000 MG tablet Take by mouth. 09/16/2016: Received from: [facility] Received Sig: Take 1 tablet by mouth as needed.  . [DISCONTINUED] cetirizine (ZYRTEC) 10 MG tablet Take by mouth. 09/16/2016: Received from: [facility] Received Sig: Take 1 tablet by mouth once daily.  . [DISCONTINUED] hydrochlorothiazide (HYDRODIURIL) 25 MG tablet  09/05/2015: Received from: External Pharmacy   No facility-administered encounter medications on file as of 09/16/2016.     Allergies  Allergen Reactions  . Ketoprofen Hives and Swelling    Angioedema     Review of Systems  Constitutional: negative for chills, fatigue, fevers and sweats Eyes: negative for irritation, redness and visual disturbance Ears, nose, mouth, throat, and face: negative for hearing loss, nasal congestion, snoring and tinnitus Respiratory: negative for asthma, cough, sputum Cardiovascular: negative for chest pain, dyspnea, exertional chest pressure/discomfort, irregular heart beat, palpitations and syncope Gastrointestinal: negative for abdominal pain, change in bowel habits, nausea and vomiting Genitourinary: negative for abnormal menstrual periods, genital lesions, sexual problems and vaginal discharge, dysuria and urinary incontinence Integument/breast: negative for breast lump, breast tenderness and nipple discharge Hematologic/lymphatic: negative for bleeding and easy bruising Musculoskeletal:negative for back pain and muscle weakness Neurological: negative for dizziness, headaches, vertigo and  weakness Endocrine: negative for diabetic symptoms including polydipsia, polyuria and skin dryness Allergic/Immunologic: negative for hay fever and urticaria    Objective:   Blood pressure 111/65, pulse 81, height 5\' 3"  (1.6 m), weight 193 lb 14.4 oz (88 kg). Body mass index is 34.35 kg/m.  General Appearance:    Alert, cooperative, no distress, appears stated age  Head:    Normocephalic, without obvious abnormality, atraumatic  Eyes:    PERRL, conjunctiva/corneas clear, EOM's intact, both eyes  Ears:    Normal external ear canals, both ears  Nose:   Nares normal, septum midline, mucosa normal, no drainage or sinus tenderness  Throat:   Lips, mucosa, and tongue normal; teeth and gums normal  Neck:   Supple, symmetrical, trachea midline, no adenopathy; thyroid: no enlargement/tenderness/nodules; no carotid bruit or JVD  Back:     Symmetric, no curvature, ROM normal, no CVA tenderness  Lungs:     Clear to auscultation bilaterally, respirations unlabored  Chest Wall:    No tenderness or deformity   Heart:    Regular rate and rhythm, S1 and S2 normal, no murmur, rub or gallop  Breast Exam:  No tenderness, masses, or nipple abnormality  Abdomen:     Soft, non-tender, bowel sounds active all four quadrants, no masses, no organomegaly.    Genitalia:    Pelvic:external genitalia normal, vagina without lesions, discharge, or tenderness, rectovaginal septum  normal. Cervix normal in appearance, no cervical motion tenderness, no adnexal masses or tenderness.  Uterus normal size, shape, consistency, mobile.    Rectal:    Normal external sphincter.  No hemorrhoids appreciated. Internal exam not done.   Extremities:   Extremities normal, atraumatic, no cyanosis or edema  Pulses:   2+ and symmetric all extremities  Skin:   Skin color, texture, turgor normal, no rashes or lesions  Lymph nodes:   Cervical, supraclavicular, and axillary nodes normal  Neurologic:   CNII-XII intact, normal strength,  sensation and reflexes throughout     Labs:  Lab Results  Component Value Date   WBC 11.7 (H) 08/04/2016   HGB 13.8 08/04/2016   HCT 42.1 08/04/2016   MCV 81.9 08/04/2016   PLT 312 08/04/2016      Chemistry      Component Value Date/Time   NA 136 08/04/2016 1302   K 3.4 (L) 08/04/2016 1302   CL 97 (L) 08/04/2016 1302   CO2 28 08/04/2016 1302   BUN 18 08/04/2016 1302        CREATININE 0.79 08/04/2016 1302      Component Value Date/Time   CALCIUM 9.8 08/04/2016 1302   ALKPHOS 75 08/04/2016 1302   AST 26 08/04/2016 1302   ALT 29 08/04/2016 1302   BILITOT 0.9 08/04/2016 1302           Lab Results  Component Value Date   CHOL 238 (H) 09/05/2015   HDL 53 09/05/2015   LDLCALC 153 (H) 09/05/2015   TRIG 158 (H) 09/05/2015   CHOLHDL 4.5 (H) 09/05/2015    Lab Results  Component Value Date   TSH 1.190 09/05/2015    Assessment:    Healthy female exam.  Obesity Class I Abnormal mammogram Dyslipidemia  Plan:    Labs: up to date, performed by PCP Breast self exam technique reviewed and patient encouraged to perform self-exam monthly. Contraception: IUD (Mirena), in place x 4 years. Discussed healthy lifestyle modifications. Diagnostic mammogram of left breast to be performed in 2 days for recent prior abnormal screening mammogram. Pap smear up to date. Will need repeat in 2 years.   Dyslipidemia, to be managed by PCP. RTC in 1 year, or as needed.    Hildred LaserAnika Marisha Renier, MD [facility]

## 2016-09-16 NOTE — Patient Instructions (Signed)

## 2016-09-18 ENCOUNTER — Ambulatory Visit
Admission: RE | Admit: 2016-09-18 | Discharge: 2016-09-18 | Disposition: A | Discharge status: Home / Self Care | Payer: 59 | Source: Ambulatory Visit | Attending: Obstetrics and Gynecology | Admitting: Obstetrics and Gynecology

## 2016-09-18 DIAGNOSIS — N6320 Unspecified lump in the left breast, unspecified quadrant: Secondary | ICD-10-CM

## 2016-09-18 DIAGNOSIS — N6012 Diffuse cystic mastopathy of left breast: Secondary | ICD-10-CM | POA: Diagnosis not present

## 2016-09-18 DIAGNOSIS — R928 Other abnormal and inconclusive findings on diagnostic imaging of breast: Secondary | ICD-10-CM | POA: Diagnosis not present

## 2016-09-19 ENCOUNTER — Other Ambulatory Visit: Payer: Self-pay | Admitting: Obstetrics and Gynecology

## 2016-09-19 DIAGNOSIS — E785 Hyperlipidemia, unspecified: Secondary | ICD-10-CM | POA: Insufficient documentation

## 2016-09-19 DIAGNOSIS — N6320 Unspecified lump in the left breast, unspecified quadrant: Secondary | ICD-10-CM

## 2016-09-29 MED FILL — valACYclovir HCL 1 GM TABS: 1 | 5 days supply | Qty: 5 | Fill #2

## 2016-09-29 MED FILL — SPIRONOLACTONE 25 MG TABLET: 25 | 30 days supply | Qty: 30 | Fill #2

## 2016-09-29 MED FILL — TOVIAZ ER 8 MG TABLET: 8 | 30 days supply | Qty: 30 | Fill #1

## 2016-09-30 ENCOUNTER — Other Ambulatory Visit: Payer: Self-pay | Admitting: *Deleted

## 2016-09-30 DIAGNOSIS — K2930 Chronic superficial gastritis without bleeding: Secondary | ICD-10-CM | POA: Diagnosis not present

## 2016-09-30 DIAGNOSIS — R948 Abnormal results of function studies of other organs and systems: Secondary | ICD-10-CM | POA: Diagnosis not present

## 2016-09-30 DIAGNOSIS — R1013 Epigastric pain: Secondary | ICD-10-CM | POA: Diagnosis not present

## 2016-09-30 MED ORDER — MONTELUKAST SODIUM 10 MG PO TABS: 10.0000 mg | ORAL_TABLET | Freq: Every day | ORAL | 5 refills | Status: DC

## 2016-09-30 MED FILL — MONTELUKAST SOD 10 MG TAB: 10 | 30 days supply | Qty: 30 | Fill #0

## 2016-10-01 ENCOUNTER — Encounter: Payer: Self-pay | Admitting: *Deleted

## 2016-10-03 MED FILL — ADVAIR HFA 115-21 MCG INH: 115-21 | 30 days supply | Qty: 12 | Fill #0

## 2016-10-03 MED FILL — ESOMEPRAZOLE MAG DR 40 MG C: 40 | 30 days supply | Qty: 30 | Fill #0 | Status: TO

## 2016-10-23 ENCOUNTER — Encounter: Payer: Self-pay | Admitting: General Surgery

## 2016-10-23 ENCOUNTER — Ambulatory Visit (INDEPENDENT_AMBULATORY_CARE_PROVIDER_SITE_OTHER): Payer: 59 | Admitting: General Surgery

## 2016-10-23 ENCOUNTER — Encounter: Payer: Self-pay | Admitting: Allergy & Immunology

## 2016-10-23 ENCOUNTER — Ambulatory Visit (INDEPENDENT_AMBULATORY_CARE_PROVIDER_SITE_OTHER): Payer: 59 | Admitting: Allergy & Immunology

## 2016-10-23 VITALS — BP 124/72 | HR 78 | Resp 12 | Ht 63.0 in | Wt 198.0 lb

## 2016-10-23 VITALS — BP 120/82 | HR 76 | Temp 98.5°F | Resp 16 | Ht 63.0 in | Wt 195.0 lb

## 2016-10-23 DIAGNOSIS — R1084 Generalized abdominal pain: Secondary | ICD-10-CM

## 2016-10-23 DIAGNOSIS — J310 Chronic rhinitis: Secondary | ICD-10-CM | POA: Diagnosis not present

## 2016-10-23 DIAGNOSIS — J4540 Moderate persistent asthma, uncomplicated: Secondary | ICD-10-CM

## 2016-10-23 NOTE — Patient Instructions (Addendum)
1. Moderate persistent asthma, uncomplicated - Continue Advair two puffs once daily, increasing to two puffs twice daily with respiratory infections. - Continue with Xopenex four puffs every 4-6 hours as needed.  2. Rhinitis, non-allergic - Continue with cetirizine 10mg  daily. - Continue with Dymista 1-2 sprays per nostril twice daily.  3. Return in about 6 months (around 04/22/2017).  Please inform us of any Emergency Department visits, hospitalizations, or changes in symptoms. Call us before going to the ED for breathing or allergy symptoms since we might be able to fit you in for a sick visit. Feel free to contact us anytime with any questions, problems, or concerns.  It was a pleasure to see you again today!   Websites that have reliable patient information: 1. American Academy of Asthma, Allergy, and Immunology: www.aaaai.org 2. Food Allergy Research and Education (FARE): foodallergy.org 3. Mothers of Asthmatics: http://www.asthmacommunitynetwork.org 4. American College of Allergy, Asthma, and Immunology: www.acaai.org

## 2016-10-23 NOTE — Progress Notes (Addendum)
Patient ID: April Holloway, female   DOB: [birth year], 56 y.o.   MRN: [id number]  Chief Complaint  Patient presents with  . Abdominal Pain    HPI April Holloway is a 56 y.o. female here today for evaluation of upper abdominal pain that started in August to September. General abdominal pain radiating to the back. She was having nausea and vomiting several months ago but none since.  She was being followed by [name] NP at [facility]. She did see Dr [name] who wanted to wait til another attack. Denies any recent pain issues. HIDA scan was 07-07-16 with ultrasound . Not associated with foods. Bowles move every 2-3 days. She has been eating healthy over the past 1-2 years and has lost about 50 pounds. She is here today with Loraine Leriche, her husband.  HPI  Past Medical History:  Diagnosis Date  . Asthma   . GERD (gastroesophageal reflux disease)   . Headache    Migraines  . HSV infection   . Hypertension   . Insomnia   . Obesity   . Ovarian cyst   . Overactive bladder   . Seasonal allergies   . Sleep apnea     Past Surgical History:  Procedure Laterality Date  . APPENDECTOMY  1987  . BLADDER SURGERY  2003  . BREAST EXCISIONAL BIOPSY Right 2001   NEG  . ESOPHAGOGASTRODUODENOSCOPY (EGD) WITH PROPOFOL N/A 09/08/2016   Procedure: ESOPHAGOGASTRODUODENOSCOPY (EGD) WITH PROPOFOL;  Surgeon: Scot Jun, MD;  Location: [facility] ENDOSCOPY;  Service: Endoscopy;  Laterality: N/A;  . EYE SURGERY  2003  . GASTROSCOPY  2014, 2017  . KNEE ARTHROPLASTY Left   . SHOULDER ARTHROSCOPY Bilateral   . TONSILLECTOMY  1999  . UPPER GI ENDOSCOPY     Gallbladder issues   . WRIST SURGERY Right   . WRIST SURGERY  1997    Family History  Problem Relation Age of Onset  . Cancer Mother   . Uterine cancer Mother   . Hypertension Mother   . Fibromyalgia Mother   . Cancer Father     Melanoma  . Hypertension Father   . Arthritis Father   . Breast cancer Neg Hx     Social History Social History   Substance Use Topics  . Smoking status: Former Smoker    Quit date: 10/20/1992  . Smokeless tobacco: Never Used  . Alcohol use Yes     Comment: occasionally    Allergies  Allergen Reactions  . Ketoprofen Hives and Swelling    Angioedema    Current Outpatient Prescriptions  Medication Sig Dispense Refill  . BIOTIN PO Take 100 mg by mouth daily.    . cetirizine (ZYRTEC) 10 MG tablet Take 10 mg by mouth daily.    . Cholecalciferol (VITAMIN D3) 5000 units CAPS Take 5,000 Units by mouth daily.    . Coenzyme Q10 (CO Q-10) 400 MG CAPS Take by mouth daily.    . DULoxetine (CYMBALTA) 30 MG capsule Reported on 02/28/2016  1  . DYMISTA 137-50 MCG/ACT SUSP   3  . ferrous sulfate 325 (65 FE) MG tablet Take 325 mg by mouth daily with breakfast.    . fluticasone-salmeterol (ADVAIR HFA) 115-21 MCG/ACT inhaler Inhale 2 puffs into the lungs 2 (two) times daily. (Patient taking differently: Inhale 2 puffs into the lungs daily. ) 1 Inhaler 2  . Glucosamine-Chondroit-Vit C-Mn (GLUCOSAMINE 1500 COMPLEX PO) Take by mouth daily.    . Lactobacillus (ACIDOPHILUS PO) Take by mouth daily.    Marland Kitchen  levalbuterol (XOPENEX HFA) 45 MCG/ACT inhaler Inhale 2 puffs into the lungs every 6 (six) hours as needed for wheezing.    . Lutein 40 MG CAPS Take by mouth daily.    . Melatonin 10 MG TABS Take by mouth.    . montelukast (SINGULAIR) 10 MG tablet Take 1 tablet (10 mg total) by mouth at bedtime. 30 tablet 5  . Multiple Vitamin (MULTIVITAMIN) capsule Take 1 capsule by mouth daily.    Marland Kitchen. spironolactone (ALDACTONE) 25 MG tablet Take by mouth.    . sucralfate (CARAFATE) 1 g tablet Take 1 g by mouth 3 (three) times daily.    . SUMAtriptan (IMITREX) 100 MG tablet Take 100 mg by mouth every 2 (two) hours as needed for migraine. May repeat in 2 hours if headache persists or recurs.    . TOVIAZ 8 MG TB24 tablet Take 8 mg by mouth daily.   11  . valACYclovir (VALTREX) 1000 MG tablet Take 1 tablet (1,000 mg total) by mouth daily. 5  tablet 4  . esomeprazole (NEXIUM) 40 MG capsule Take 40 mg by mouth daily at 12 noon.    . QUEtiapine Fumarate (SEROQUEL XR) 150 MG 24 hr tablet Take 150 mg by mouth at bedtime.     No current facility-administered medications for this visit.     Review of Systems Review of Systems  Constitutional: Negative.   Respiratory: Negative.   Cardiovascular: Negative.   Gastrointestinal: Positive for abdominal pain, nausea and vomiting.    Blood pressure 124/72, pulse 78, resp. rate 12, height 5\' 3"  (1.6 m), weight 198 lb (89.8 kg).  Physical Exam Physical Exam  Constitutional: She is oriented to person, place, and time. She appears well-developed and well-nourished.  HENT:  Mouth/Throat: Oropharynx is clear and moist.  Eyes: Conjunctivae are normal. No scleral icterus.  Neck: Neck supple.  Cardiovascular: Normal rate, regular rhythm and normal heart sounds.   Pulmonary/Chest: Effort normal and breath sounds normal.  Abdominal: Soft. Normal appearance and bowel sounds are normal. There is no tenderness.  Lymphadenopathy:    She has no cervical adenopathy.  Neurological: She is alert and oriented to person, place, and time.  Skin: Skin is warm and dry.  Psychiatric: Her behavior is normal.    Data Reviewed 07/07/2016 imaging studies: HIDA scan IMPRESSION: Abnormally low ejection fraction of radiotracer from the gallbladder, a finding that is indicative of a degree of biliary dyskinesia. Cystic and common bile ducts are patent as is evidenced by visualization of gallbladder and small bowel. Dominant ultrasound IMPRESSION: 1. No gallstones.  No ductal dilatation. 2. The tail of the pancreas is obscured by bowel gas. 3. Bilateral nonobstructing renal calculi.  08/11/2016 general surgery consultation note by Renda RollsWilton Smith, M.D.  09/08/2016 upper endoscopy results reviewed: Erythematous mucosa in the antrum.    DUODENUM, SECOND PORTION AND BULB; COLD BIOPSY:  - DUODENAL MUCOSA  WITH PRESERVED VILLOUS ARCHITECTURE AND BRUNNER'S  GLAND HYPERPLASIA.  - NEGATIVE FOR INTRA-EPITHELIAL LYMPHOCYTOSIS, DYSPLASIA AND MALIGNANCY.   Marland Kitchen. STOMACH, ANTRUM AND BODY; COLD BIOPSY:  - ANTRAL MUCOSA WITH MINIMAL CHRONIC GASTRITIS.  - OXYNTIC MUCOSA WITH FOCAL MILD CHRONIC GASTRITIS.  - NEGATIVE FOR H. PYLORI, DYSPLASIA AND MALIGNANCY.  Assessment    Episodic abdominal pain, presently asymptomatic.    Plan    Patient did not have reproduction of her symptoms during her HIDA scan, and has no dietary history to suggest that the biliary tree is presently the source of her intermittent abdominal discomfort.  At this  time, I were encouraged watchful waiting rather than cholecystectomy. I'm not convinced at this time that the risks of surgery are likely to yield any clear benefit.    Follow up as needed or if symptoms return or worsen. The patient is aware to call back for any questions or concerns.   This information has been scribed by Dorathy Daft RN, BSN,BC.   Earline Mayotte 10/28/2016, 10:23 AM

## 2016-10-23 NOTE — Patient Instructions (Addendum)
The patient is aware to call back for any questions or concerns. Follow up as needed or if symptoms return or worsen.

## 2016-10-23 NOTE — Progress Notes (Signed)
FOLLOW UP  Date of Service/Encounter:  10/23/16   Assessment:   Moderate persistent asthma without complication  Rhinitis, non-allergic   Asthma Reportables:  Severity: moderate persistent  Risk: low Control: well controlled  Seasonal Influenza Vaccine: yes    Plan/Recommendations:   1. Moderate persistent asthma, uncomplicated - Continue Advair two puffs once daily, increasing to two puffs twice daily with respiratory infections. - Continue with Xopenex four puffs every 4-6 hours as needed. - She did recover well following the last visit and has a plan at home for dealing with subsequent respiratories.   2. Rhinitis, non-allergic - Continue with cetirizine 10mg  daily. - Continue with Dymista 1-2 sprays per nostril twice daily.  3. Return in about 6 months (around 04/22/2017).    Subjective:   April Holloway is a 56 y.o. female presenting today for follow up of  Chief Complaint  Patient presents with  . Asthma    no flare ups. no rescue inhaler used.     April Holloway has a history of the following: Patient Active Problem List   Diagnosis Date Noted  . Dyslipidemia 09/19/2016  . Moderate persistent asthma with acute exacerbation 07/28/2016  . Rhinitis, non-allergic 07/28/2016    History obtained from: chart review and patient.  April Holloway was referred by Frederich Chick, MD.     April Holloway is a 56 y.o. female presenting for a follow up visit. We last saw April Holloway in October 2 17 at which time I diagnosed her with   asthma exacerbation. We gave her a long taper of prednisone and recommended continuing with Xopenex and increasing her Advair to 2 puffs twice daily. She also has a history of nonallergic rhinitis that is treated with cetirizine 10 mg daily as well as Dymista 1-2 sprays per nostril twice daily.  Since last visit, she reports that she has done well. She did not need an antibiotic after the last visit. She is now on Advair 2 puffs once daily. She does  occasionally go up to 2 puffs twice daily but is well-controlled at the lower dose. April's asthma has been well controlled. She has not required rescue medication, experienced nocturnal awakenings due to lower respiratory symptoms, nor have activities of daily living been limited.   With regards to her nonallergic rhinitis, she remains on cetirizine 10 mg daily which she takes throughout the year. She does pay quite a bit of money for the Dymista and typically only uses it as needed spring and fall, which are her worst months.  Otherwise, there have been no changes to her past medical history, surgical history, family history, or social history.    Review of Systems: a 14-point review of systems is pertinent for what is mentioned in HPI.  Otherwise, all other systems were negative. Constitutional: negative other than that listed in the HPI Eyes: negative other than that listed in the HPI Ears, nose, mouth, throat, and face: negative other than that listed in the HPI Respiratory: negative other than that listed in the HPI Cardiovascular: negative other than that listed in the HPI Gastrointestinal: negative other than that listed in the HPI Genitourinary: negative other than that listed in the HPI Integument: negative other than that listed in the HPI Hematologic: negative other than that listed in the HPI Musculoskeletal: negative other than that listed in the HPI Neurological: negative other than that listed in the HPI Allergy/Immunologic: negative other than that listed in the HPI    Objective:  Blood pressure 120/82, pulse 76, temperature 98.5 F (36.9 C), temperature source Oral, resp. rate 16, height 5\' 3"  (1.6 m), weight 195 lb (88.5 kg), SpO2 96 %. Body mass index is 34.54 kg/m.   Physical Exam:  General: Alert, interactive, in no acute distress.Cooperative with the exam. Very pleasant. Eyes: No conjunctival injection present on the right, No conjunctival injection present  on the left, PERRL bilaterally, No discharge on the right, No discharge on the left and No Horner-Trantas dots present Ears: Right TM pearly gray with normal light reflex, Left TM pearly gray with normal light reflex, Right TM intact without perforation and Left TM intact without perforation.  Nose/Throat: External nose within normal limits and septum midline, turbinates edematous with clear discharge, post-pharynx mildly erythematous without cobblestoning in the posterior oropharynx. Tonsils 2+ without exudates Neck: Supple without thyromegaly. Lungs: Clear to auscultation without wheezing, rhonchi or rales. No increased work of breathing. CV: Normal S1/S2, no murmurs. Capillary refill <2 seconds.  Skin: Warm and dry, without lesions or rashes. Neuro:   Grossly intact. No focal deficits appreciated. Responsive to questions.   Diagnostic studies:   Spirometry: results normal (FEV1: 2.16/87%, FVC: 2.71/90%, FEV1/FVC: 79%).    Spirometry consistent with normal pattern.       Malachi BondsJoel Rieley Hausman, MD FAAAAI [facility]

## 2016-10-28 DIAGNOSIS — R1084 Generalized abdominal pain: Secondary | ICD-10-CM | POA: Insufficient documentation

## 2016-11-11 MED FILL — SPIRONOLACTONE 25 MG TABLET: 25 | 30 days supply | Qty: 30 | Fill #3

## 2016-11-11 MED FILL — ESOMEPRAZOLE MAG DR 40 MG C: 40 | 30 days supply | Qty: 30 | Fill #1 | Status: TO

## 2016-11-18 MED FILL — ADVAIR HFA 115-21 MCG INH: 115-21 | 30 days supply | Qty: 12 | Fill #1

## 2016-11-18 MED FILL — MONTELUKAST SOD 10 MG TAB: 10 | 30 days supply | Qty: 30 | Fill #1

## 2016-11-20 MED FILL — OXYBUTYNIN CL ER 15 MG TAB: 15 | 30 days supply | Qty: 30 | Fill #0

## 2016-11-24 MED FILL — DULoxetine HCL 60 MG CPEP: 60 | 90 days supply | Qty: 90 | Fill #0

## 2016-11-24 MED FILL — QUETIAPINE FUMARATE 50 MG T: 50 | 90 days supply | Qty: 270 | Fill #1

## 2016-11-24 MED FILL — DULoxetine HCL 30 MG CPEP: 30 | 90 days supply | Qty: 90 | Fill #0

## 2016-11-26 DIAGNOSIS — F419 Anxiety disorder, unspecified: Secondary | ICD-10-CM | POA: Diagnosis not present

## 2016-11-26 DIAGNOSIS — F331 Major depressive disorder, recurrent, moderate: Secondary | ICD-10-CM | POA: Diagnosis not present

## 2016-12-22 MED FILL — OXYBUTYNIN CL ER 15 MG TAB: 15 | 30 days supply | Qty: 30 | Fill #1 | Status: TO

## 2016-12-22 MED FILL — SPIRONOLACTONE 25 MG TABLET: 25 | 30 days supply | Qty: 30 | Fill #4 | Status: TO

## 2016-12-22 MED FILL — MONTELUKAST SOD 10 MG TAB: 10 | 30 days supply | Qty: 30 | Fill #2 | Status: TO

## 2017-01-31 ENCOUNTER — Encounter: Payer: Self-pay | Admitting: Obstetrics and Gynecology

## 2017-02-04 IMAGING — NM NM HEPATO W/GB/PHARM/[PERSON_NAME]
3 series · 18 of 18 positions shown · non-contrast
Comparison: None.

CLINICAL DATA: Abdominal pain for 1 month

EXAM:
NUCLEAR MEDICINE HEPATOBILIARY IMAGING WITH GALLBLADDER EF
Views:  Anterior right upper quadrant
RADIOPHARMACEUTICALS:  5.05 mCi 4c-77m  Choletec IV

[Series 1000: gallbladder ef dynamic (results) · 4.80mm/px · 6 of 120 frames shown]
[frame 11/120]
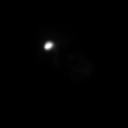
[frame 31/120]
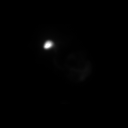
[frame 51/120]
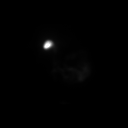
[frame 71/120]
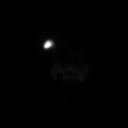
[frame 91/120]
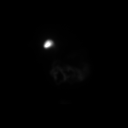
[frame 111/120]
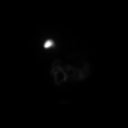

[Series 1000: hepatobiliary dynamic · 9.59mm/px · 6 of 60 frames shown]
[frame 6/60]
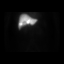
[frame 16/60]
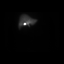
[frame 26/60]
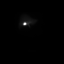
[frame 36/60]
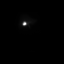
[frame 46/60]
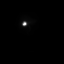
[frame 56/60]
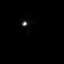

[Series 1000: gallbladder ef dynamic · 4.80mm/px · 6 of 120 frames shown]
[frame 11/120]
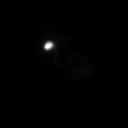
[frame 31/120]
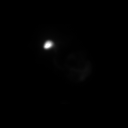
[frame 51/120]
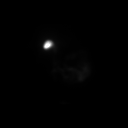
[frame 71/120]
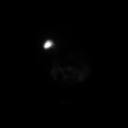
[frame 91/120]
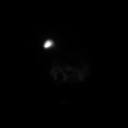
[frame 111/120]
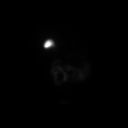

[18 of 18 positions shown; findings below may reference images not displayed]

FINDINGS: Liver uptake of radiotracer is normal. There is prompt visualization
of gallbladder and small bowel, indicating patency of the cystic and
common bile ducts. The patient consumed 8 ounces of Ensure orally
with calculation of the computer generated ejection fraction of
radiotracer from the gallbladder. The patient did not have clinical
symptoms associated with the oral Ensure consumption. The ejection
fraction of radiotracer from the gallbladder is abnormally low at
19%, normal greater than 33% using the oral agent.
IMPRESSION: Abnormally low ejection fraction of radiotracer from the
gallbladder, a finding that is indicative of a degree of biliary
dyskinesia. Cystic and common bile ducts are patent as is evidenced
by visualization of gallbladder and small bowel.

## 2017-04-29 ENCOUNTER — Other Ambulatory Visit: Payer: Self-pay | Admitting: Allergy & Immunology

## 2017-04-29 MED ORDER — FLUTICASONE-SALMETEROL 115-21 MCG/ACT IN AERO: 2.0000 | INHALATION_SPRAY | Freq: Two times a day (BID) | RESPIRATORY_TRACT | 0 refills | Status: DC

## 2017-04-29 MED ORDER — MONTELUKAST SODIUM 10 MG PO TABS: 10.0000 mg | ORAL_TABLET | Freq: Every day | ORAL | 0 refills | Status: DC

## 2017-04-29 NOTE — Telephone Encounter (Signed)
Patient has a change of pharmacy. [facility]. She needs a refill on her Singulair and Advair. She is requesting a 90 day supply for both.

## 2017-04-29 NOTE — Telephone Encounter (Signed)
Called patient. I informed her that I can send a 30 day refill in and when she comes in for a follow up we can send 90 day scripts. Patient has an appointment for 05/28/2017 with Dr. Dellis AnesGallagher at 5:15.

## 2017-05-28 ENCOUNTER — Encounter: Payer: Self-pay | Admitting: Allergy & Immunology

## 2017-05-28 ENCOUNTER — Ambulatory Visit (INDEPENDENT_AMBULATORY_CARE_PROVIDER_SITE_OTHER): Payer: PRIVATE HEALTH INSURANCE | Admitting: Allergy & Immunology

## 2017-05-28 VITALS — BP 140/82 | HR 76 | Resp 16

## 2017-05-28 DIAGNOSIS — J310 Chronic rhinitis: Secondary | ICD-10-CM

## 2017-05-28 DIAGNOSIS — J4540 Moderate persistent asthma, uncomplicated: Secondary | ICD-10-CM | POA: Diagnosis not present

## 2017-05-28 MED ORDER — FLUTICASONE-SALMETEROL 115-21 MCG/ACT IN AERO: 2.0000 | INHALATION_SPRAY | Freq: Two times a day (BID) | RESPIRATORY_TRACT | 1 refills | Status: AC

## 2017-05-28 MED ORDER — MONTELUKAST SODIUM 10 MG PO TABS: 10.0000 mg | ORAL_TABLET | Freq: Every day | ORAL | 1 refills | Status: AC

## 2017-05-28 NOTE — Patient Instructions (Addendum)
1. Moderate persistent asthma, uncomplicated - Lung testing looks great today. - We will not make any medication changes at this time. - Daily controller medication(s): Advair 115/21 two puffs once daily - Rescue medications: Xopenex 4 puffs every 4-6 hours as needed - Changes during respiratory infections or worsening symptoms: increase Advair 115/21 to 2 puffs twice daily for TWO WEEKS. - Asthma control goals:  * Full participation in all desired activities (may need albuterol before activity) * Albuterol use two time or less a week on average (not counting use with activity) * Cough interfering with sleep two time or less a month * Oral steroids no more than once a year * No hospitalizations  2. Rhinitis, non-allergic - Continue with cetirizine 10mg  daily. - Continue with Dymista 1-2 sprays per nostril twice daily.  3. Return in about 6 months (around 11/28/2017).  Please inform us of any Emergency Department visits, hospitalizations, or changes in symptoms. Call us before going to the ED for breathing or allergy symptoms since we might be able to fit you in for a sick visit. Feel free to contact us anytime with any questions, problems, or concerns.  It was a pleasure to see you again today!   Websites that have reliable patient information: 1. American Academy of Asthma, Allergy, and Immunology: www.aaaai.org 2. Food Allergy Research and Education (FARE): foodallergy.org 3. Mothers of Asthmatics: http://www.asthmacommunitynetwork.org 4. American College of Allergy, Asthma, and Immunology: www.acaai.org

## 2017-05-28 NOTE — Progress Notes (Signed)
FOLLOW UP  Date of Service/Encounter:  05/28/17   Assessment:   Moderate persistent asthma without complication  Rhinitis, non-allergic   Asthma Reportables:  Severity: moderate persistent  Risk: low Control: well controlled   Plan/Recommendations:   1. Moderate persistent asthma, uncomplicated - Lung testing looks great today. - We will not make any medication changes at this time. - Daily controller medication(s): Advair 115/21 two puffs once daily - Rescue medications: Xopenex 4 puffs every 4-6 hours as needed - Changes during respiratory infections or worsening symptoms: increase Advair 115/21 to 2 puffs twice daily for TWO WEEKS. - Asthma control goals:  * Full participation in all desired activities (may need albuterol before activity) * Albuterol use two time or less a week on average (not counting use with activity) * Cough interfering with sleep two time or less a month * Oral steroids no more than once a year * No hospitalizations  2. Rhinitis, non-allergic - Continue with cetirizine 10mg  daily. - Continue with Dymista 1-2 sprays per nostril twice daily.  3. Return in about 6 months (around 11/28/2017).  Subjective:   April Holloway is a 56 y.o. female presenting today for follow up of  Chief Complaint  Patient presents with  . Asthma    April Holloway has a history of the following: Patient Active Problem List   Diagnosis Date Noted  . Generalized abdominal pain 10/28/2016  . Dyslipidemia 09/19/2016  . Moderate persistent asthma with acute exacerbation 07/28/2016  . Rhinitis, non-allergic 07/28/2016    History obtained from: chart review and patient.  April Holloway's Primary Care Provider is Shirlean MylarWebb, Carol, MD.     April Holloway is a 56 y.o. female presenting for a follow up visit. She was last seen in January 2018 at which time she was doing well. At that time, we continued her on Advair two puffs once daily, increasing to two puffs twice daily with  respiratory flares. We also continued her on Xopenex four puffs every 4-6 hours as needed. For her nonallergic rhinitis, we continued her on cetirizine 10 mg daily as well as Dymista 1-2 sprays per nostril twice daily.  Since the last visit, she has done very well. She remains on her Advair 1:15/21 g 2 puffs once daily. She has not needed to increase to 2 puffs twice daily since last visit. She feels that her asthma has been under better control. April Holloway's asthma has been well controlled. She has not required rescue medication, experienced nocturnal awakenings due to lower respiratory symptoms, nor have activities of daily living been limited. She has required no Emergency Department or Urgent Care visits for her asthma. She has required zero courses of systemic steroids for asthma exacerbations since the last visit. ACT score today is 22, indicating excellent asthma symptom control.   Her rhinitis is under good control with cetirizine 10 mg daily. She uses the Dymista only as needed. She has there one bottle every 2-3 months at the most. She has done very well. 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  Otherwise, there have been no changes to her past medical history, surgical history, family history, or social history.    Review of Systems: a 14-point review of systems is pertinent for what is mentioned in HPI.  Otherwise, all other systems were negative. Constitutional: negative other than that listed in  the HPI Eyes: negative other than that listed in the HPI Ears, nose, mouth, throat, and face: negative other than that listed in the HPI Respiratory: negative other than that listed in the HPI Cardiovascular: negative other than that listed in the HPI Gastrointestinal: negative other than that listed in the HPI Genitourinary: negative other than that listed in the  HPI Integument: negative other than that listed in the HPI Hematologic: negative other than that listed in the HPI Musculoskeletal: negative other than that listed in the HPI Neurological: negative other than that listed in the HPI Allergy/Immunologic: negative other than that listed in the HPI    Objective:   Blood pressure 140/82, pulse 76, resp. rate 16. There is no height or weight on file to calculate BMI.   Physical Exam:  General: Alert, interactive, in no acute distress. Smiling.  Eyes: No conjunctival injection present on the right, No conjunctival injection present on the left, PERRL bilaterally, No discharge on the right, No discharge on the left and No Horner-Trantas dots present Ears: Right TM pearly gray with normal light reflex, Left TM pearly gray with normal light reflex, Right TM intact without perforation and Left TM intact without perforation.  Nose/Throat: External nose within normal limits and septum midline, turbinates edematous with clear discharge, post-pharynx mildly erythematous without cobblestoning in the posterior oropharynx. Tonsils 2+ without exudates Neck: Supple without thyromegaly. Lungs: Clear to auscultation without wheezing, rhonchi or rales. No increased work of breathing. CV: Normal S1/S2, no murmurs. Capillary refill <2 seconds.  Skin: Warm and dry, without lesions or rashes. Neuro:   Grossly intact. No focal deficits appreciated. Responsive to questions.   Diagnostic studies:   Spirometry: results normal (FEV1: 2.18/89%, FVC: 3.09/103%, FEV1/FVC: 70%).    Spirometry consistent with normal pattern.   Allergy Studies: none    [name], MD [facility]

## 2017-08-21 ENCOUNTER — Encounter: Payer: Self-pay | Admitting: Obstetrics and Gynecology

## 2017-09-17 ENCOUNTER — Encounter: Payer: 59 | Admitting: Obstetrics and Gynecology
